# Patient Record
Sex: Female | Born: 1995 | Race: Black or African American | Hispanic: No | Marital: Single | Smoking: Former smoker
Health system: Southern US, Community
[De-identification: ages and names within clinical notes are randomized; demographics above are authoritative.]

## PROBLEM LIST (undated history)

## (undated) DIAGNOSIS — N83209 Unspecified ovarian cyst, unspecified side: Secondary | ICD-10-CM

## (undated) HISTORY — PX: NO PAST SURGERIES: SHX2092

---

## 2014-08-14 ENCOUNTER — Encounter (HOSPITAL_COMMUNITY): Payer: Self-pay | Admitting: Emergency Medicine

## 2014-08-14 ENCOUNTER — Emergency Department (HOSPITAL_COMMUNITY)
Admission: EM | Admit: 2014-08-14 | Discharge: 2014-08-14 | Disposition: A | Discharge status: Home / Self Care | Payer: Medicaid Other | Attending: Emergency Medicine | Admitting: Emergency Medicine

## 2014-08-14 DIAGNOSIS — J029 Acute pharyngitis, unspecified: Secondary | ICD-10-CM | POA: Diagnosis not present

## 2014-08-14 DIAGNOSIS — B9789 Other viral agents as the cause of diseases classified elsewhere: Secondary | ICD-10-CM

## 2014-08-14 DIAGNOSIS — J028 Acute pharyngitis due to other specified organisms: Secondary | ICD-10-CM

## 2014-08-14 LAB — RAPID STREP SCREEN (MED CTR MEBANE ONLY): Streptococcus, Group A Screen (Direct): NEGATIVE

## 2014-08-14 NOTE — ED Notes (Signed)
Pt states she began having a sore throat when she woke up this morning. Pt states she began having trouble swallowing because she said she "feels like I was swallowing my tonsils." States she's also had an increasing low grade temp throughout the morning.

## 2014-08-14 NOTE — Discharge Instructions (Signed)
Your strep test was negative today. The sample has been sent for culture. If the culture is positive, the Emergency department will contact you and call in a prescription for antibiotics. Take tylenol and motrin.  Strep Throat Strep throat is an infection of the throat caused by a bacteria named Streptococcus pyogenes. Your health care provider may call the infection streptococcal "tonsillitis" or "pharyngitis" depending on whether there are signs of inflammation in the tonsils or back of the throat. Strep throat is most common in children aged 5-15 years during the cold months of the year, but it can occur in people of any age during any season. This infection is spread from person to person (contagious) through coughing, sneezing, or other close contact. SIGNS AND SYMPTOMS   Fever or chills.  Painful, swollen, red tonsils or throat.  Pain or difficulty when swallowing.  White or yellow spots on the tonsils or throat.  Swollen, tender lymph nodes or "glands" of the neck or under the jaw.  Red rash all over the body (rare). DIAGNOSIS  Many different infections can cause the same symptoms. A test must be done to confirm the diagnosis so the right treatment can be given. A "rapid strep test" can help your health care provider make the diagnosis in a few minutes. If this test is not available, a light swab of the infected area can be used for a throat culture test. If a throat culture test is done, results are usually available in a day or two. TREATMENT  Strep throat is treated with antibiotic medicine. HOME CARE INSTRUCTIONS   Gargle with 1 tsp of salt in 1 cup of warm water, 3-4 times per day or as needed for comfort.  Family members who also have a sore throat or fever should be tested for strep throat and treated with antibiotics if they have the strep infection.  Make sure everyone in your household washes their hands well.  Do not share food, drinking cups, or personal items that  could cause the infection to spread to others.  You may need to eat a soft food diet until your sore throat gets better.  Drink enough water and fluids to keep your urine clear or pale yellow. This will help prevent dehydration.  Get plenty of rest.  Stay home from school, day care, or work until you have been on antibiotics for 24 hours.  Take medicines only as directed by your health care provider.  Take your antibiotic medicine as directed by your health care provider. Finish it even if you start to feel better. SEEK MEDICAL CARE IF:   The glands in your neck continue to enlarge.  You develop a rash, cough, or earache.  You cough up green, yellow-brown, or bloody sputum.  You have pain or discomfort not controlled by medicines.  Your problems seem to be getting worse rather than better.  You have a fever. SEEK IMMEDIATE MEDICAL CARE IF:   You develop any new symptoms such as vomiting, severe headache, stiff or painful neck, chest pain, shortness of breath, or trouble swallowing.  You develop severe throat pain, drooling, or changes in your voice.  You develop swelling of the neck, or the skin on the neck becomes red and tender.  You develop signs of dehydration, such as fatigue, dry mouth, and decreased urination.  You become increasingly sleepy, or you cannot wake up completely. MAKE SURE YOU:  Understand these instructions.  Will watch your condition.  Will get help right away  if you are not doing well or get worse. Document Released: 03/26/2000 Document Revised: 08/13/2013 Document Reviewed: 05/28/2010 [facility] Patient Information 2015 [employer] [place]. This information is not intended to replace advice given to you by your health care provider. Make sure you discuss any questions you have with your health care provider.

## 2014-08-14 NOTE — ED Provider Notes (Signed)
CSN: [id number]     Arrival date & time 08/14/14  1350 History  This chart was scribed for Arthor CaptainAbigail Pavneet Markwood, PA-C working with Layla MawKristen N Ward, DO by Elveria Risingimelie Horne, ED Scribe. This patient was seen in room WTR8/WTR8 and the patient's care was started at 3:24 PM.   No chief complaint on file.  The history is provided by the patient. No language interpreter was used.   HPI Comments: Stacey Cobb is a 19 y.o. female who presents to the Emergency Department complaining of sore throat with onset upon wakening this morning. Patient reports visualizing swollen tonsils and pain with swallowing today. Patient reports history of Strep throat twice. Patient uncertain of recent contacts with similar symptoms. Patient reports low grade fever today, but denies additional symptoms.    No past medical history on file. No past surgical history on file. No family history on file. History  Substance Use Topics  . Smoking status: Not on file  . Smokeless tobacco: Not on file  . Alcohol Use: Not on file   OB History    No data available     Review of Systems  Constitutional: Negative for fever.  HENT: Positive for sore throat.       Allergies  Review of patient's allergies indicates not on file.  Home Medications   Prior to Admission medications   Not on File   Triage Vitals: BP 132/80 mmHg  Pulse 89  Temp(Src) 98.1 F (36.7 C) (Oral)  Resp 18  SpO2 98% Physical Exam  Constitutional: She is oriented to person, place, and time. She appears well-developed and well-nourished. No distress.  HENT:  Head: Normocephalic and atraumatic.  Uvular edematous and erythematous. Tonsillar erythema and hypertrophy. Lymphadenopathy.   Eyes: EOM are normal.  Neck: Neck supple. No tracheal deviation present.  Cardiovascular: Normal rate.   Pulmonary/Chest: Effort normal. No respiratory distress.  Musculoskeletal: Normal range of motion.  Neurological: She is alert and oriented to person, place, and time.   Skin: Skin is warm and dry.  Psychiatric: She has a normal mood and affect. Her behavior is normal.  Nursing note and vitals reviewed.   ED Course  Procedures (including critical care time)  COORDINATION OF CARE: 3:28 PM- Awaiting rapid Strep. Discussed treatment plan with patient at bedside and patient agreed to plan.   Labs Review Labs Reviewed - No data to display  Imaging Review No results found.   EKG Interpretation None      MDM   Final diagnoses:  Acute viral pharyngitis    Pt afebrile without tonsillar exudate, negative strep. Presents with mild cervical lymphadenopathy, & dysphagia; diagnosis of viral pharyngitis. No abx indicated. DC w symptomatic tx for pain  Pt does not appear dehydrated, but did discuss importance of water rehydration. Presentation non concerning for PTA or infxn spread to soft tissue. No trismus or uvula deviation. Specific return precautions discussed. Pt able to drink water in ED without difficulty with intact air way. Recommended PCP follow up.   I personally performed the services described in this documentation, which was scribed in my presence. The recorded information has been reviewed and is accurate.      Arthor CaptainAbigail Willie Loy, PA-C 08/14/14 1621  Layla MawKristen N Ward, DO 08/14/14 1623

## 2014-08-16 LAB — CULTURE, GROUP A STREP: Strep A Culture: NEGATIVE

## 2014-10-25 ENCOUNTER — Inpatient Hospital Stay (HOSPITAL_COMMUNITY)
Admission: AD | Admit: 2014-10-25 | Discharge: 2014-10-25 | Disposition: A | Discharge status: Home / Self Care | Payer: Medicaid Other | Source: Ambulatory Visit | Attending: Family Medicine | Admitting: Family Medicine

## 2014-10-25 DIAGNOSIS — R519 Headache, unspecified: Secondary | ICD-10-CM

## 2014-10-25 DIAGNOSIS — A5901 Trichomonal vulvovaginitis: Secondary | ICD-10-CM | POA: Insufficient documentation

## 2014-10-25 DIAGNOSIS — R51 Headache: Secondary | ICD-10-CM | POA: Diagnosis not present

## 2014-10-25 DIAGNOSIS — A599 Trichomoniasis, unspecified: Secondary | ICD-10-CM

## 2014-10-25 DIAGNOSIS — M940 Chondrocostal junction syndrome [Tietze]: Secondary | ICD-10-CM | POA: Diagnosis not present

## 2014-10-25 LAB — URINE MICROSCOPIC-ADD ON

## 2014-10-25 LAB — URINALYSIS, ROUTINE W REFLEX MICROSCOPIC
Bilirubin Urine: NEGATIVE
Glucose, UA: NEGATIVE mg/dL
HGB URINE DIPSTICK: NEGATIVE
KETONES UR: NEGATIVE mg/dL
Nitrite: NEGATIVE
Protein, ur: NEGATIVE mg/dL
Specific Gravity, Urine: 1.025 (ref 1.005–1.030)
Urobilinogen, UA: 0.2 mg/dL (ref 0.0–1.0)
pH: 5.5 (ref 5.0–8.0)

## 2014-10-25 LAB — POCT PREGNANCY, URINE: Preg Test, Ur: NEGATIVE

## 2014-10-25 MED ORDER — KETOROLAC TROMETHAMINE 60 MG/2ML IM SOLN
60.0000 mg | Freq: Once | INTRAMUSCULAR | Status: AC
  Administered 2014-10-25: 60 mg via INTRAMUSCULAR
  Filled 2014-10-25: qty 2

## 2014-10-25 MED ORDER — METRONIDAZOLE 500 MG PO TABS
2000.0000 mg | ORAL_TABLET | Freq: Once | ORAL | Status: AC
  Administered 2014-10-25: 2000 mg via ORAL
  Filled 2014-10-25: qty 4

## 2014-10-25 NOTE — MAU Note (Signed)
Pain in rt lower lung when she takes a breath.  Awakened at 0200, feeling like she couldn't get her breath. Still feels that way. Wasn't able to go back to sleep.

## 2014-10-25 NOTE — MAU Provider Note (Signed)
History     CSN: [id number]  Arrival date and time: 10/25/14 [id number]   First Provider Initiated Contact with Patient 10/25/14 0827      Chief Complaint  Patient presents with  . Shortness of Breath  . Headache   HPI Stacey Cobb 19 y.o. G0 nonpregnant female presents for eval of headache and difficulty breathing.  HA started at 8:30 last evening.  It became severe during the night.  Located in right temporal region with pulsating, described as a sharp pressure.  Moving made it worse Unable to determine if sensitive to lights or noises and denies nausea, vomiting.  No prior h/o HA.  No medication tried last night or today.  Pain is now 5/10.  She barely slept at all.  At 2 am, she awoke to shortness of breath in her sleep.  When she would take a breath, the pain was 10/10 in the right ribs.  This is somewhat improved today but she does still notice some discomfort with deep breaths.  She denies fever, weakness, syncope, dysuria, vaginal discharge.   OB History    No data available      No past medical history on file.  No past surgical history on file.  No family history on file.  History  Substance Use Topics  . Smoking status: Not on file  . Smokeless tobacco: Not on file  . Alcohol Use: Not on file    Allergies:  Allergies  Allergen Reactions  . Banana Anaphylaxis and Hives  . Other Anaphylaxis and Hives    sesame    No prescriptions prior to admission    ROS Pertinent ROS in HPI.  All other systems are negative.   Physical Exam   Blood pressure 131/71, pulse 93, temperature 98.6 F (37 C), temperature source Oral, resp. rate 18, last menstrual period 10/11/2014, SpO2 100 %.  Physical Exam  Constitutional: She is oriented to person, place, and time. She appears well-developed and well-nourished.  HENT:  Head: Normocephalic.  Eyes: EOM are normal.  Neck: Normal range of motion.  Cardiovascular: Normal rate and normal heart sounds.   Respiratory: Effort  normal and breath sounds normal. No respiratory distress. She has no wheezes. She has no rales.  Right pain in ribs with deep inhalation  Musculoskeletal: Normal range of motion.  Neurological: She is alert and oriented to person, place, and time.  Skin: Skin is warm and dry.  Psychiatric: She has a normal mood and affect.   Results for orders placed or performed during the hospital encounter of 10/25/14 (from the past 24 hour(s))  Pregnancy, urine POC     Status: None   Collection Time: 10/25/14  8:08 AM  Result Value Ref Range   Preg Test, Ur NEGATIVE NEGATIVE  Urinalysis, Routine w reflex microscopic [facility])     Status: Abnormal   Collection Time: 10/25/14  8:12 AM  Result Value Ref Range   Color, Urine YELLOW YELLOW   APPearance CLEAR CLEAR   Specific Gravity, Urine 1.025 1.005 - 1.030   pH 5.5 5.0 - 8.0   Glucose, UA NEGATIVE NEGATIVE mg/dL   Hgb urine dipstick NEGATIVE NEGATIVE   Bilirubin Urine NEGATIVE NEGATIVE   Ketones, ur NEGATIVE NEGATIVE mg/dL   Protein, ur NEGATIVE NEGATIVE mg/dL   Urobilinogen, UA 0.2 0.0 - 1.0 mg/dL   Nitrite NEGATIVE NEGATIVE   Leukocytes, UA MODERATE (A) NEGATIVE  Urine microscopic-add on     Status: Abnormal   Collection Time: 10/25/14  8:12 AM  Result Value Ref Range   Squamous Epithelial / LPF MANY (A) RARE   WBC, UA 7-10 <3 WBC/hpf   RBC / HPF 3-6 <3 RBC/hpf   Bacteria, UA MANY (A) RARE   Urine-Other MUCOUS PRESENT     MAU Course  Procedures  MDM IM Toradol for HA pain and presumed costochondritis Pt notes improvement in both with treatment.   Trichomonas noted on U/A.  Flagyl ordered in house.  Assessment and Plan   1. Headache, unspecified headache type   2. Costochondritis   3. Trichimoniasis    P: Discharge to home No etoh/IC x 1 week All partners to be treated at [facility] for trich Try OTC ibuprofen or Aleve for next HA See PCP if no improvement.   [facility] ED if worsening of symptoms of shortness of breath/  HA  Bertram Denver 10/25/2014, 8:30 AM

## 2014-10-25 NOTE — MAU Note (Signed)
Pt also C/O HA since 2030 last night, woke up with head pounding, still has HA now.  Has not taken any meds.  No hx of migraines.

## 2014-10-25 NOTE — Discharge Instructions (Signed)
Headaches, Frequently Asked Questions °MIGRAINE HEADACHES °Q: What is migraine? What causes it? How can I treat it? °A: Generally, migraine headaches begin as a dull ache. Then they develop into a constant, throbbing, and pulsating pain. You may experience pain at the temples. You may experience pain at the front or back of one or both sides of the head. The pain is usually accompanied by a combination of: °· Nausea. °· Vomiting. °· Sensitivity to light and noise. °Some people (about 15%) experience an aura (see below) before an attack. The cause of migraine is believed to be chemical reactions in the brain. Treatment for migraine may include over-the-counter or prescription medications. It may also include self-help techniques. These include relaxation training and biofeedback.  °Q: What is an aura? °A: About 15% of people with migraine get an "aura". This is a sign of neurological symptoms that occur before a migraine headache. You may see wavy or jagged lines, dots, or flashing lights. You might experience tunnel vision or blind spots in one or both eyes. The aura can include visual or auditory hallucinations (something imagined). It may include disruptions in smell (such as strange odors), taste or touch. Other symptoms include: °· Numbness. °· A "pins and needles" sensation. °· Difficulty in recalling or speaking the correct word. °These neurological events may last as long as 60 minutes. These symptoms will fade as the headache begins. °Q: What is a trigger? °A: Certain physical or environmental factors can lead to or "trigger" a migraine. These include: °· Foods. °· Hormonal changes. °· Weather. °· Stress. °It is important to remember that triggers are different for everyone. To help prevent migraine attacks, you need to figure out which triggers affect you. Keep a headache diary. This is a good way to track triggers. The diary will help you talk to your healthcare professional about your condition. °Q: Does  weather affect migraines? °A: Bright sunshine, hot, humid conditions, and drastic changes in barometric pressure may lead to, or "trigger," a migraine attack in some people. But studies have shown that weather does not act as a trigger for everyone with migraines. °Q: What is the link between migraine and hormones? °A: Hormones start and regulate many of your body's functions. Hormones keep your body in balance within a constantly changing environment. The levels of hormones in your body are unbalanced at times. Examples are during menstruation, pregnancy, or menopause. That can lead to a migraine attack. In fact, about three quarters of all women with migraine report that their attacks are related to the menstrual cycle.  °Q: Is there an increased risk of stroke for migraine sufferers? °A: The likelihood of a migraine attack causing a stroke is very remote. That is not to say that migraine sufferers cannot have a stroke associated with their migraines. In persons under age 40, the most common associated factor for stroke is migraine headache. But over the course of a person's normal life span, the occurrence of migraine headache may actually be associated with a reduced risk of dying from cerebrovascular disease due to stroke.  °Q: What are acute medications for migraine? °A: Acute medications are used to treat the pain of the headache after it has started. Examples over-the-counter medications, NSAIDs, ergots, and triptans.  °Q: What are the triptans? °A: Triptans are the newest class of abortive medications. They are specifically targeted to treat migraine. Triptans are vasoconstrictors. They moderate some chemical reactions in the brain. The triptans work on receptors in your brain. Triptans help   to restore the balance of a neurotransmitter called serotonin. Fluctuations in levels of serotonin are thought to be a main cause of migraine.  °Q: Are over-the-counter medications for migraine effective? °A:  Over-the-counter, or "OTC," medications may be effective in relieving mild to moderate pain and associated symptoms of migraine. But you should see your caregiver before beginning any treatment regimen for migraine.  °Q: What are preventive medications for migraine? °A: Preventive medications for migraine are sometimes referred to as "prophylactic" treatments. They are used to reduce the frequency, severity, and length of migraine attacks. Examples of preventive medications include antiepileptic medications, antidepressants, beta-blockers, calcium channel blockers, and NSAIDs (nonsteroidal anti-inflammatory drugs). °Q: Why are anticonvulsants used to treat migraine? °A: During the past few years, there has been an increased interest in antiepileptic drugs for the prevention of migraine. They are sometimes referred to as "anticonvulsants". Both epilepsy and migraine may be caused by similar reactions in the brain.  °Q: Why are antidepressants used to treat migraine? °A: Antidepressants are typically used to treat people with depression. They may reduce migraine frequency by regulating chemical levels, such as serotonin, in the brain.  °Q: What alternative therapies are used to treat migraine? °A: The term "alternative therapies" is often used to describe treatments considered outside the scope of conventional Western medicine. Examples of alternative therapy include acupuncture, acupressure, and yoga. Another common alternative treatment is herbal therapy. Some herbs are believed to relieve headache pain. Always discuss alternative therapies with your caregiver before proceeding. Some herbal products contain arsenic and other toxins. °TENSION HEADACHES °Q: What is a tension-type headache? What causes it? How can I treat it? °A: Tension-type headaches occur randomly. They are often the result of temporary stress, anxiety, fatigue, or anger. Symptoms include soreness in your temples, a tightening band-like sensation  around your head (a "vice-like" ache). Symptoms can also include a pulling feeling, pressure sensations, and contracting head and neck muscles. The headache begins in your forehead, temples, or the back of your head and neck. Treatment for tension-type headache may include over-the-counter or prescription medications. Treatment may also include self-help techniques such as relaxation training and biofeedback. °CLUSTER HEADACHES °Q: What is a cluster headache? What causes it? How can I treat it? °A: Cluster headache gets its name because the attacks come in groups. The pain arrives with little, if any, warning. It is usually on one side of the head. A tearing or bloodshot eye and a runny nose on the same side of the headache may also accompany the pain. Cluster headaches are believed to be caused by chemical reactions in the brain. They have been described as the most severe and intense of any headache type. Treatment for cluster headache includes prescription medication and oxygen. °SINUS HEADACHES °Q: What is a sinus headache? What causes it? How can I treat it? °A: When a cavity in the bones of the face and skull (a sinus) becomes inflamed, the inflammation will cause localized pain. This condition is usually the result of an allergic reaction, a tumor, or an infection. If your headache is caused by a sinus blockage, such as an infection, you will probably have a fever. An x-ray will confirm a sinus blockage. Your caregiver's treatment might include antibiotics for the infection, as well as antihistamines or decongestants.  °REBOUND HEADACHES °Q: What is a rebound headache? What causes it? How can I treat it? °A: A pattern of taking acute headache medications too often can lead to a condition known as "rebound headache."   A pattern of taking too much headache medication includes taking it more than 2 days per week or in excessive amounts. That means more than the label or a caregiver advises. With rebound  headaches, your medications not only stop relieving pain, they actually begin to cause headaches. Doctors treat rebound headache by tapering the medication that is being overused. Sometimes your caregiver will gradually substitute a different type of treatment or medication. Stopping may be a challenge. Regularly overusing a medication increases the potential for serious side effects. Consult a caregiver if you regularly use headache medications more than 2 days per week or more than the label advises. ADDITIONAL QUESTIONS AND ANSWERS Q: What is biofeedback? A: Biofeedback is a self-help treatment. Biofeedback uses special equipment to monitor your body's involuntary physical responses. Biofeedback monitors:  Breathing.  Pulse.  Heart rate.  Temperature.  Muscle tension.  Brain activity. Biofeedback helps you refine and perfect your relaxation exercises. You learn to control the physical responses that are related to stress. Once the technique has been mastered, you do not need the equipment any more. Q: Are headaches hereditary? A: Four out of five (80%) of people that suffer report a family history of migraine. Scientists are not sure if this is genetic or a family predisposition. Despite the uncertainty, a child has a 50% chance of having migraine if one parent suffers. The child has a 75% chance if both parents suffer.  Q: Can children get headaches? A: By the time they reach high school, most young people have experienced some type of headache. Many safe and effective approaches or medications can prevent a headache from occurring or stop it after it has begun.  Q: What type of doctor should I see to diagnose and treat my headache? A: Start with your primary caregiver. Discuss his or her experience and approach to headaches. Discuss methods of classification, diagnosis, and treatment. Your caregiver may decide to recommend you to a headache specialist, depending upon your symptoms or other  physical conditions. Having diabetes, allergies, etc., may require a more comprehensive and inclusive approach to your headache. The National Headache Foundation will provide, upon request, a list of [facility] physician members in your state. Document Released: 06/19/2003 Document Revised: 06/21/2011 Document Reviewed: 11/27/2007 [facility] Patient Information 2015 [place]. This information is not intended to replace advice given to you by your health care provider. Make sure you discuss any questions you have with your health care provider. Costochondritis Costochondritis, sometimes called Tietze syndrome, is a swelling and irritation (inflammation) of the tissue (cartilage) that connects your ribs with your breastbone (sternum). It causes pain in the chest and rib area. Costochondritis usually goes away on its own over time. It can take up to 6 weeks or longer to get better, especially if you are unable to limit your activities. CAUSES  Some cases of costochondritis have no known cause. Possible causes include:  Injury (trauma).  Exercise or activity such as lifting.  Severe coughing. SIGNS AND SYMPTOMS  Pain and tenderness in the chest and rib area.  Pain that gets worse when coughing or taking deep breaths.  Pain that gets worse with specific movements. DIAGNOSIS  Your health care provider will do a physical exam and ask about your symptoms. Chest X-rays or other tests may be done to rule out other problems. TREATMENT  Costochondritis usually goes away on its own over time. Your health care provider may prescribe medicine to help relieve pain. HOME CARE INSTRUCTIONS   Avoid exhausting  physical activity. Try not to strain your ribs during normal activity. This would include any activities using chest, abdominal, and side muscles, especially if heavy weights are used.  Apply ice to the affected area for the first 2 days after the pain begins.  Put ice in a plastic bag.  Place a  towel between your skin and the bag.  Leave the ice on for 20 minutes, 2-3 times a day.  Only take over-the-counter or prescription medicines as directed by your health care provider. SEEK MEDICAL CARE IF:  You have redness or swelling at the rib joints. These are signs of infection.  Your pain does not go away despite rest or medicine. SEEK IMMEDIATE MEDICAL CARE IF:   Your pain increases or you are very uncomfortable.  You have shortness of breath or difficulty breathing.  You cough up blood.  You have worse chest pains, sweating, or vomiting.  You have a fever or persistent symptoms for more than 2-3 days.  You have a fever and your symptoms suddenly get worse. MAKE SURE YOU:   Understand these instructions.  Will watch your condition.  Will get help right away if you are not doing well or get worse. Document Released: 01/06/2005 Document Revised: 01/17/2013 Document Reviewed: 10/31/2012 [facility] Patient Information 2015 [place], [place]. This information is not intended to replace advice given to you by your health care provider. Make sure you discuss any questions you have with your health care provider. Trichomonas Test The trichomonas test is done to diagnose trichomoniasis, an infection caused by an organism called Trichomonas. Trichomoniasis is a sexually transmitted infection (STI). In women, it causes vaginal infections. In men, it can cause the tube that carries urine (urethra) to become inflamed (urethritis). You may have this test as a part of a routine screening for STIs or if you have symptoms of trichomoniasis. To perform the test, your health care provider will take a sample of discharge. The sample is taken from the vagina or cervix in women and from the urethra in men. A urine sample can also be used for testing. RESULTS It is your responsibility to obtain your test results. Ask the lab or department performing the test when and how you will get your results.  Contact your health care provider to discuss any questions you have about your results.  Meaning of Negative Test Results A negative test means you do not have trichomoniasis. Follow your health care provider's directions about any follow-up testing.  Meaning of Positive Test Results A positive test result means you have an active infection that needs to be treated with antibiotic medicine. All your current sexual partners must also be treated or it is likely you will get reinfected.  If your test is positive, your health care provider will start you on medicine and may advise you to:  Not have sexual intercourse until your infection has cleared up.  Use a latex condom properly every time you have sexual intercourse.  Limit the number of sexual partners you have. The more partners you have, the greater your risk of contracting trichomoniasis or another STI.  Tell all sexual partners about your infection so they can also be treated and to prevent reinfection. Document Released: 05/01/2004 Document Revised: 08/13/2013 Document Reviewed: 04/10/2013 [facility] Patient Information 2015 [place], [place]. This information is not intended to replace advice given to you by your health care provider. Make sure you discuss any questions you have with your health care provider.

## 2014-10-25 NOTE — MAU Note (Signed)
Urine has an odor, will add on a UA

## 2015-03-10 ENCOUNTER — Encounter (HOSPITAL_COMMUNITY): Payer: Self-pay | Admitting: Emergency Medicine

## 2015-03-10 ENCOUNTER — Emergency Department (HOSPITAL_COMMUNITY)
Admission: EM | Admit: 2015-03-10 | Discharge: 2015-03-10 | Disposition: A | Discharge status: Home / Self Care | Payer: Medicaid Other | Attending: Emergency Medicine | Admitting: Emergency Medicine

## 2015-03-10 DIAGNOSIS — Z87891 Personal history of nicotine dependence: Secondary | ICD-10-CM | POA: Diagnosis not present

## 2015-03-10 DIAGNOSIS — J069 Acute upper respiratory infection, unspecified: Secondary | ICD-10-CM | POA: Insufficient documentation

## 2015-03-10 DIAGNOSIS — R0981 Nasal congestion: Secondary | ICD-10-CM | POA: Diagnosis present

## 2015-03-10 NOTE — ED Provider Notes (Signed)
CSN: [id number]     Arrival date & time 03/10/15  [id number] History   First MD Initiated Contact with Patient 03/10/15 1016     Chief Complaint  Patient presents with  . URI     (Consider location/radiation/quality/duration/timing/severity/associated sxs/prior Treatment) Patient is a 19 y.o. female presenting with URI. The history is provided by the patient. No language interpreter was used.  URI Presenting symptoms: congestion, cough, fever, rhinorrhea and sore throat   Severity:  Moderate Onset quality:  Sudden Duration:  1 day Timing:  Constant Progression:  Unchanged Chronicity:  New Relieved by:  Nothing Worsened by:  Nothing tried Ineffective treatments:  None tried Associated symptoms: no neck pain and no sinus pain     History reviewed. No pertinent past medical history. History reviewed. No pertinent past surgical history. No family history on file. Social History  Substance Use Topics  . Smoking status: Former Smoker    Types: Cigarettes  . Smokeless tobacco: None  . Alcohol Use: No   OB History    No data available     Review of Systems  Constitutional: Positive for fever.  HENT: Positive for congestion, rhinorrhea and sore throat.   Respiratory: Positive for cough.   Musculoskeletal: Negative for neck pain.  All other systems reviewed and are negative.     Allergies  Banana and Other  Home Medications   Prior to Admission medications   Not on File   BP 130/63 mmHg  Pulse 97  Temp(Src) 98.4 F (36.9 C) (Oral)  Resp 18  Ht 5\' 3"  (1.6 m)  SpO2 99%  LMP 03/01/2015 Physical Exam  Constitutional: She is oriented to person, place, and time. She appears well-developed and well-nourished.  HENT:  Right Ear: External ear normal.  Left Ear: External ear normal.  Nose: Rhinorrhea present.  Mouth/Throat: Posterior oropharyngeal erythema present.  Cardiovascular: Normal rate and regular rhythm.   Pulmonary/Chest: Effort normal.  Musculoskeletal:  Normal range of motion.  Neurological: She is alert and oriented to person, place, and time.  Skin: Skin is warm and dry.  Psychiatric: She has a normal mood and affect.  Nursing note and vitals reviewed.   ED Course  Procedures (including critical care time) Labs Review Labs Reviewed - No data to display  Imaging Review No results found. I have personally reviewed and evaluated these images and lab results as part of my medical decision-making.   EKG Interpretation None      MDM   Final diagnoses:  URI (upper respiratory infection)    Likely uri symptoms. Discussed supportive care at home. Discussed with pt return precautions.   Teressa LowerVrinda Kathreen Dileo, NP 03/10/15 1107  Tilden FossaElizabeth Rees, MD 03/12/15 [phone]

## 2015-03-10 NOTE — Discharge Instructions (Signed)
Upper Respiratory Infection, Adult Most upper respiratory infections (URIs) are a viral infection of the air passages leading to the lungs. A URI affects the nose, throat, and upper air passages. The most common type of URI is nasopharyngitis and is typically referred to as "the common cold." URIs run their course and usually go away on their own. Most of the time, a URI does not require medical attention, but sometimes a bacterial infection in the upper airways can follow a viral infection. This is called a secondary infection. Sinus and middle ear infections are common types of secondary upper respiratory infections. Bacterial pneumonia can also complicate a URI. A URI can worsen asthma and chronic obstructive pulmonary disease (COPD). Sometimes, these complications can require emergency medical care and may be life threatening.  CAUSES Almost all URIs are caused by viruses. A virus is a type of germ and can spread from one person to another.  RISKS FACTORS You may be at risk for a URI if:   You smoke.   You have chronic heart or lung disease.  You have a weakened defense (immune) system.   You are very young or very old.   You have nasal allergies or asthma.  You work in crowded or poorly ventilated areas.  You work in health care facilities or schools. SIGNS AND SYMPTOMS  Symptoms typically develop 2-3 days after you come in contact with a cold virus. Most viral URIs last 7-10 days. However, viral URIs from the influenza virus (flu virus) can last 14-18 days and are typically more severe. Symptoms may include:   Runny or stuffy (congested) nose.   Sneezing.   Cough.   Sore throat.   Headache.   Fatigue.   Fever.   Loss of appetite.   Pain in your forehead, behind your eyes, and over your cheekbones (sinus pain).  Muscle aches.  DIAGNOSIS  Your health care provider may diagnose a URI by:  Physical exam.  Tests to check that your symptoms are not due to  another condition such as:  Strep throat.  Sinusitis.  Pneumonia.  Asthma. TREATMENT  A URI goes away on its own with time. It cannot be cured with medicines, but medicines may be prescribed or recommended to relieve symptoms. Medicines may help:  Reduce your fever.  Reduce your cough.  Relieve nasal congestion. HOME CARE INSTRUCTIONS   Take medicines only as directed by your health care provider.   Gargle warm saltwater or take cough drops to comfort your throat as directed by your health care provider.  Use a warm mist humidifier or inhale steam from a shower to increase air moisture. This may make it easier to breathe.  Drink enough fluid to keep your urine clear or pale yellow.   Eat soups and other clear broths and maintain good nutrition.   Rest as needed.   Return to work when your temperature has returned to normal or as your health care provider advises. You may need to stay home longer to avoid infecting others. You can also use a face mask and careful hand washing to prevent spread of the virus.  Increase the usage of your inhaler if you have asthma.   Do not use any tobacco products, including cigarettes, chewing tobacco, or electronic cigarettes. If you need help quitting, ask your health care provider. PREVENTION  The best way to protect yourself from getting a cold is to practice good hygiene.   Avoid oral or hand contact with people with cold   symptoms.   Wash your hands often if contact occurs.  There is no clear evidence that vitamin C, vitamin E, echinacea, or exercise reduces the chance of developing a cold. However, it is always recommended to get plenty of rest, exercise, and practice good nutrition.  SEEK MEDICAL CARE IF:   You are getting worse rather than better.   Your symptoms are not controlled by medicine.   You have chills.  You have worsening shortness of breath.  You have brown or red mucus.  You have yellow or brown nasal  discharge.  You have pain in your face, especially when you bend forward.  You have a fever.  You have swollen neck glands.  You have pain while swallowing.  You have white areas in the back of your throat. SEEK IMMEDIATE MEDICAL CARE IF:   You have severe or persistent:  Headache.  Ear pain.  Sinus pain.  Chest pain.  You have chronic lung disease and any of the following:  Wheezing.  Prolonged cough.  Coughing up blood.  A change in your usual mucus.  You have a stiff neck.  You have changes in your:  Vision.  Hearing.  Thinking.  Mood. MAKE SURE YOU:   Understand these instructions.  Will watch your condition.  Will get help right away if you are not doing well or get worse.   This information is not intended to replace advice given to you by your health care provider. Make sure you discuss any questions you have with your health care provider.   Document Released: 09/22/2000 Document Revised: 08/13/2014 Document Reviewed: 07/04/2013 Elsevier Interactive Patient Education 2016 [employer]  

## 2015-03-10 NOTE — ED Notes (Signed)
Patient states she has had a fever since yesterday morning. Patient reports congestion with a productive cough (green/yellow, thick). Reports small amount of blood in phlegm this morning.

## 2017-03-31 ENCOUNTER — Encounter (HOSPITAL_COMMUNITY): Payer: Self-pay | Admitting: *Deleted

## 2017-03-31 ENCOUNTER — Inpatient Hospital Stay (HOSPITAL_COMMUNITY)
Admission: AD | Admit: 2017-03-31 | Discharge: 2017-03-31 | Disposition: A | Discharge status: Home / Self Care | Payer: Self-pay | Source: Ambulatory Visit | Attending: Obstetrics and Gynecology | Admitting: Obstetrics and Gynecology

## 2017-03-31 ENCOUNTER — Other Ambulatory Visit: Payer: Self-pay

## 2017-03-31 DIAGNOSIS — M549 Dorsalgia, unspecified: Secondary | ICD-10-CM

## 2017-03-31 DIAGNOSIS — N946 Dysmenorrhea, unspecified: Secondary | ICD-10-CM

## 2017-03-31 DIAGNOSIS — Z3202 Encounter for pregnancy test, result negative: Secondary | ICD-10-CM | POA: Insufficient documentation

## 2017-03-31 DIAGNOSIS — M545 Low back pain: Secondary | ICD-10-CM | POA: Insufficient documentation

## 2017-03-31 HISTORY — DX: Unspecified ovarian cyst, unspecified side: N83.209

## 2017-03-31 LAB — URINALYSIS, ROUTINE W REFLEX MICROSCOPIC
BILIRUBIN URINE: NEGATIVE
Glucose, UA: NEGATIVE mg/dL
Ketones, ur: NEGATIVE mg/dL
LEUKOCYTES UA: NEGATIVE
NITRITE: NEGATIVE
PH: 5 (ref 5.0–8.0)
Protein, ur: NEGATIVE mg/dL
SPECIFIC GRAVITY, URINE: 1.018 (ref 1.005–1.030)

## 2017-03-31 LAB — WET PREP, GENITAL
Clue Cells Wet Prep HPF POC: NONE SEEN
SPERM: NONE SEEN
Trich, Wet Prep: NONE SEEN
Yeast Wet Prep HPF POC: NONE SEEN

## 2017-03-31 LAB — CBC
HEMATOCRIT: 33.1 % — AB (ref 36.0–46.0)
Hemoglobin: 10.8 g/dL — ABNORMAL LOW (ref 12.0–15.0)
MCH: 24.8 pg — ABNORMAL LOW (ref 26.0–34.0)
MCHC: 32.6 g/dL (ref 30.0–36.0)
MCV: 75.9 fL — AB (ref 78.0–100.0)
Platelets: 309 10*3/uL (ref 150–400)
RBC: 4.36 MIL/uL (ref 3.87–5.11)
RDW: 14.9 % (ref 11.5–15.5)
WBC: 5.6 10*3/uL (ref 4.0–10.5)

## 2017-03-31 LAB — ABO/RH: ABO/RH(D): O POS

## 2017-03-31 LAB — HCG, QUANTITATIVE, PREGNANCY: hCG, Beta Chain, Quant, S: <1 m[IU]/mL (ref <5)

## 2017-03-31 LAB — POCT PREGNANCY, URINE: Preg Test, Ur: NEGATIVE

## 2017-03-31 NOTE — MAU Note (Signed)
Had about 4 +HPT end of Oct.  Has not seen a dr yet, moved recently.  Started having pain last couple stays.  Woke up with spotting then pain woke her at 1000, noted  'full on bleeding' today.

## 2017-03-31 NOTE — Discharge Instructions (Signed)
Today you are not having a miscarriage.  This is your menstrual period. Apply for Medicaid and get established with a doctor here in town. You can take ibuprofen over the counter by the package directions for your pain.  In late 2019, the [facility] will [facility] the [facility]. At that time, the MAU (Maternity Admissions Unit), where you are being seen today, will no longer take care of non-pregnant patients. We strongly encourage you to find a doctor's office before that time, so that you can be seen with any GYN concerns, like vaginal discharge, urinary tract infection, etc.. in a timely manner.  In order to make an office visit more convenient, the Center for [facility] at [facility] will be offering evening hours with same-day appointments, walk-in appointments and scheduled appointments available during this time.  Center for [facility] @ [facility] Hours: Monday - 8am - 7:30 pm with walk-in between 4pm- 7:30 pm Tuesday - 8 am - 5 pm (starting 07/12/17 we will be open late and accepting walk-ins from 4pm - 7:30pm) Wednesday - 8 am - 5 pm (starting 10/12/17 we will be open late and accepting walk-ins from 4pm - 7:30pm) Thursday 8 am - 5 pm (starting 01/12/18 we will be open late and accepting walk-ins from 4pm - 7:30pm) Friday 8 am - 5 pm  For an appointment please call the Center for [facility] @ [facility] at [phone]  For urgent needs, [facility] is also available for management of urgent GYN complaints such as vaginal discharge or urinary tract infections.

## 2017-03-31 NOTE — MAU Provider Note (Signed)
History     CSN: [id number]  Arrival date and time: 03/31/17 1147   First Provider Initiated Contact with Patient 03/31/17 1231     Possible Miscarriage  HPI Stacey Cobb 21 y.o. Comes to MAU today with what she thinks is a miscarriage.  She had 4 positive pregnancy tests in October.  She has not seen a doctor.  Did not have menses in November.  Today saw a white clot in the toilet as she was flushing.   Does not have a doctor here.  Moved back to [place] after her mother died this fall.  Would like to be pregnant.  Is worried with all the vaginal bleeding she is having today.  She awakened last night with abdominal pain and low back pain.  Today after having that clot in the toilet her abdominal pain is much less.   Still having lower back pain but does not want any medication for pain.   OB History    Gravida Para Term Preterm AB Living   1       1     SAB TAB Ectopic Multiple Live Births   1              Past Medical History:  Diagnosis Date  . Ovarian cyst     Past Surgical History:  Procedure Laterality Date  . NO PAST SURGERIES      Family History  Problem Relation Age of Onset  . Diabetes Mother   . Hypertension Mother     Social History   Tobacco Use  . Smoking status: Former Smoker    Types: Cigarettes  . Smokeless tobacco: Never Used  Substance Use Topics  . Alcohol use: No  . Drug use: No    Allergies:  Allergies  Allergen Reactions  . Banana Anaphylaxis and Hives  . Other Anaphylaxis and Hives    Sesame seeds, oil, etc    No medications prior to admission.    Review of Systems  Constitutional: Negative for fever.  Gastrointestinal: Positive for abdominal pain. Negative for nausea and vomiting.  Genitourinary: Positive for vaginal bleeding. Negative for vaginal discharge.  Musculoskeletal: Positive for back pain.   Physical Exam   Blood pressure 135/78, pulse 86, temperature 98.4 F (36.9 C), temperature source Oral, resp. rate 18,  weight 250 lb 12 oz (113.7 kg), last menstrual period 01/15/2017, SpO2 100 %.  Physical Exam  Nursing note and vitals reviewed. Constitutional: She is oriented to person, place, and time. She appears well-developed and well-nourished.  Obese  HENT:  Head: Normocephalic.  Eyes: EOM are normal.  Neck: Neck supple.  GI: Soft. There is no tenderness. There is no rebound and no guarding.  Genitourinary:  Genitourinary Comments: Speculum exam: Vagina - Mod amount of dark blood, no odor Cervix - Small amount of active bleeding Bimanual exam: Cervix closed Uterus non tender, unable to size due to habitus Adnexa non tender, no masses bilaterally GC/Chlam, wet prep done Chaperone present for exam.   Musculoskeletal: Normal range of motion.  Neurological: She is alert and oriented to person, place, and time.  Skin: Skin is warm and dry.  Psychiatric: She has a normal mood and affect.    MAU Course  Procedures Results for orders placed or performed during the hospital encounter of 03/31/17 (from the past 24 hour(s))  Urinalysis, Routine w reflex microscopic     Status: Abnormal   Collection Time: 03/31/17 11:59 AM  Result Value Ref Range   Color,  Urine YELLOW YELLOW   APPearance CLEAR CLEAR   Specific Gravity, Urine 1.018 1.005 - 1.030   pH 5.0 5.0 - 8.0   Glucose, UA NEGATIVE NEGATIVE mg/dL   Hgb urine dipstick LARGE (A) NEGATIVE   Bilirubin Urine NEGATIVE NEGATIVE   Ketones, ur NEGATIVE NEGATIVE mg/dL   Protein, ur NEGATIVE NEGATIVE mg/dL   Nitrite NEGATIVE NEGATIVE   Leukocytes, UA NEGATIVE NEGATIVE   RBC / HPF TOO NUMEROUS TO COUNT 0 - 5 RBC/hpf   WBC, UA 0-5 0 - 5 WBC/hpf   Bacteria, UA RARE (A) NONE SEEN   Squamous Epithelial / LPF 0-5 (A) NONE SEEN   Mucus PRESENT   Pregnancy, urine POC     Status: None   Collection Time: 03/31/17 12:07 PM  Result Value Ref Range   Preg Test, Ur NEGATIVE NEGATIVE  CBC     Status: Abnormal   Collection Time: 03/31/17 12:54 PM  Result  Value Ref Range   WBC 5.6 4.0 - 10.5 K/uL   RBC 4.36 3.87 - 5.11 MIL/uL   Hemoglobin 10.8 (L) 12.0 - 15.0 g/dL   HCT 40.933.1 (L) 81.136.0 - 91.446.0 %   MCV 75.9 (L) 78.0 - 100.0 fL   MCH 24.8 (L) 26.0 - 34.0 pg   MCHC 32.6 30.0 - 36.0 g/dL   RDW 78.214.9 95.611.5 - 21.315.5 %   Platelets 309 150 - 400 K/uL  hCG, quantitative, pregnancy     Status: None   Collection Time: 03/31/17 12:54 PM  Result Value Ref Range   hCG, Beta Chain, Quant, S <1 <5 mIU/mL  ABO/Rh     Status: None (Preliminary result)   Collection Time: 03/31/17 12:54 PM  Result Value Ref Range   ABO/RH(D) O POS   Wet prep, genital     Status: Abnormal   Collection Time: 03/31/17  1:15 PM  Result Value Ref Range   Yeast Wet Prep HPF POC NONE SEEN NONE SEEN   Trich, Wet Prep NONE SEEN NONE SEEN   Clue Cells Wet Prep HPF POC NONE SEEN NONE SEEN   WBC, Wet Prep HPF POC FEW (A) NONE SEEN   Sperm NONE SEEN     MDM Discussed with client that this is not a miscarriage.  Her pregnancy hormone level is zero and it would still be elevated if this was a miscarriage.  Advised that the pregnancy tests in October were likely false positive tests.  Client still plans to work toward achieving pregnancy.  Assessment and Plan  Dysmenorrhea Law back pain  Plan Today you are not having a miscarriage.  This is your menstrual period. Apply for Medicaid and get established with a doctor here in town. You can take ibuprofen over the counter by the package directions for your pain.   Lakecia Deschamps L Christorpher Hisaw 03/31/2017, 1:19 PM

## 2017-03-31 NOTE — MAU Note (Signed)
RN entered patient room to begin assessment. Patient request RN to come at a later time as she needs to finish talking on the phone.

## 2017-04-01 LAB — GC/CHLAMYDIA PROBE AMP (~~LOC~~) NOT AT ARMC
Chlamydia: NEGATIVE
Neisseria Gonorrhea: NEGATIVE

## 2018-01-31 ENCOUNTER — Emergency Department (HOSPITAL_COMMUNITY): Payer: Medicaid Other

## 2018-01-31 ENCOUNTER — Emergency Department (HOSPITAL_COMMUNITY)
Admission: EM | Admit: 2018-01-31 | Discharge: 2018-01-31 | Disposition: A | Discharge status: Home / Self Care | Payer: Medicaid Other | Attending: Emergency Medicine | Admitting: Emergency Medicine

## 2018-01-31 ENCOUNTER — Encounter (HOSPITAL_COMMUNITY): Payer: Self-pay | Admitting: Emergency Medicine

## 2018-01-31 ENCOUNTER — Other Ambulatory Visit: Payer: Self-pay

## 2018-01-31 DIAGNOSIS — Z87891 Personal history of nicotine dependence: Secondary | ICD-10-CM | POA: Insufficient documentation

## 2018-01-31 DIAGNOSIS — B9789 Other viral agents as the cause of diseases classified elsewhere: Secondary | ICD-10-CM | POA: Insufficient documentation

## 2018-01-31 DIAGNOSIS — J069 Acute upper respiratory infection, unspecified: Secondary | ICD-10-CM | POA: Insufficient documentation

## 2018-01-31 MED ORDER — CETIRIZINE HCL 5 MG PO TABS: 5.0000 mg | ORAL_TABLET | Freq: Every day | ORAL | 0 refills | Status: DC

## 2018-01-31 MED ORDER — BENZONATATE 100 MG PO CAPS: 100.0000 mg | ORAL_CAPSULE | Freq: Three times a day (TID) | ORAL | 0 refills | Status: DC

## 2018-01-31 NOTE — ED Notes (Signed)
Patient transported to X-ray 

## 2018-01-31 NOTE — ED Triage Notes (Signed)
Pt reports fever for 6 days, her highest was 102. She reports body aches, chills and cough. Reports having the flu shot. Afebrile at triage.

## 2018-01-31 NOTE — ED Notes (Signed)
ED Provider at bedside. 

## 2018-01-31 NOTE — Discharge Instructions (Signed)
Return to ED for worsening symptoms, chest pain, vomiting or coughing up blood, severe abdominal pain, lightheadedness or loss of consciousness.

## 2018-01-31 NOTE — ED Provider Notes (Signed)
[facility] EMERGENCY DEPARTMENT Provider Note   CSN: [id number] Arrival date & time: 01/31/18  1134     History   Chief Complaint Chief Complaint  Patient presents with  . Fever    HPI Stacey Cobb is a 22 y.o. female who presents to ED for evaluation of fever, cough for the past 6 days.  States that the cough is productive with phlegm.  Did have 2-day history of right-sided dental pain which improved without any medication.  States she has had a fever with T-max 102.  She took 1 dose of Tylenol but has otherwise been using home therapies such as ginger tea, putting an onion in her sock, taking an ice bath.  She did not use any home remedies prior to arrival.  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  This was prior to her symptom onset.  She did receive her influenza vaccine.  Denies any hematemesis, hemoptysis, recent travel, abdominal pain.  HPI  Past Medical History:  Diagnosis Date  . Ovarian cyst     There are no active problems to display for this patient.   Past Surgical History:  Procedure Laterality Date  . NO PAST SURGERIES       OB History    Gravida  1   Para      Term      Preterm      AB  1   Living        SAB  1   TAB      Ectopic      Multiple      Live Births               Home Medications    Prior to Admission medications   Medication Sig Start Date End Date Taking? Authorizing Provider  benzonatate (TESSALON) 100 MG capsule Take 1 capsule (100 mg total) by mouth every 8 (eight) hours. 01/31/18   Danuel Felicetti, PA-C  cetirizine (ZYRTEC) 5 MG tablet Take 1 tablet (5 mg total) by mouth daily. 01/31/18   Dietrich Pates, PA-C    Family History Family History  Problem Relation Age of Onset  . Diabetes Mother   . Hypertension Mother     Social History Social History   Tobacco Use  . Smoking status: Former Smoker    Types:  Cigarettes  . Smokeless tobacco: Never Used  Substance Use Topics  . Alcohol use: No  . Drug use: No     Allergies   Banana and Other   Review of Systems Review of Systems  Constitutional: Positive for fever. Negative for chills.  HENT: Positive for dental problem. Negative for congestion, mouth sores, rhinorrhea and sinus pressure.   Respiratory: Positive for cough.   Gastrointestinal: Negative for abdominal pain.     Physical Exam Updated Vital Signs BP 137/76   Pulse 81   Temp 98.8 F (37.1 C) (Oral)   Resp 16   LMP  (Approximate)   SpO2 99%   Physical Exam  Constitutional: She appears well-developed and well-nourished. No distress.  HENT:  Head: Normocephalic and atraumatic.  Nose: Nose normal.  Mouth/Throat: Uvula is midline and oropharynx is clear and moist. Abnormal dentition. No dental abscesses or dental caries.  No facial, neck or cheek swelling noted. No pooling of secretions or trismus.  Normal voice noted with no difficulty swallowing or breathing.  No submandibular erythema,  edema or crepitus noted.  Eyes: Conjunctivae and EOM are normal. No scleral icterus.  Neck: Normal range of motion.  Cardiovascular: Normal rate, regular rhythm and normal heart sounds.  Pulmonary/Chest: Effort normal and breath sounds normal. No respiratory distress.  Neurological: She is alert.  Skin: No rash noted. She is not diaphoretic.  Psychiatric: She has a normal mood and affect.  Nursing note and vitals reviewed.    ED Treatments / Results  Labs (all labs ordered are listed, but only abnormal results are displayed) Labs Reviewed - No data to display  EKG None  Radiology Dg Chest 2 View  Result Date: 01/31/2018 CLINICAL DATA:  Cough and fever. EXAM: CHEST - 2 VIEW COMPARISON:  No prior. FINDINGS: Mediastinum and hilar structures normal. Borderline cardiomegaly. No pulmonary venous congestion. No pleural effusion or pneumothorax. Thoracic spine scoliosis. No acute  bony abnormality. IMPRESSION: 1.  Borderline cardiomegaly.  No pulmonary venous congestion. 2.  No acute pulmonary disease. Electronically Signed   By: Maisie Fus  Register   On: 01/31/2018 13:16    Procedures Procedures (including critical care time)  Medications Ordered in ED Medications - No data to display   Initial Impression / Assessment and Plan / ED Course  I have reviewed the triage vital signs and the nursing notes.  Pertinent labs & imaging results that were available during my care of the patient were reviewed by me and considered in my medical decision making (see chart for details).     22 year old female presents to ED for fever with T-max 102, cough.  Symptoms began 6 days ago.  Cough is productive with phlegm.  She works in a [employer]/text[employer] facility and was around 3 similarly ill patients last week.  On exam she is overall well-appearing.  She is afebrile here with no recent use of antipyretics.  She is not tachycardic, tachypneic or hypoxic.  Lungs are heard to auscultation bilaterally.  No abnormalities of the posterior oropharynx noted.  Suspect that her symptoms are viral in nature.  Chest x-ray is negative.  Will treat with antipyretics as needed, Tessalon Perles and antihistamine.  Advised to return to ED for any severe worsening symptoms.  Portions of this note were generated with Scientist, clinical (histocompatibility and immunogenetics). Dictation errors may occur despite best attempts at proofreading.  Final Clinical Impressions(s) / ED Diagnoses   Final diagnoses:  Viral upper respiratory tract infection    ED Discharge Orders         Ordered    benzonatate (TESSALON) 100 MG capsule  Every 8 hours     01/31/18 1320    cetirizine (ZYRTEC) 5 MG tablet  Daily     01/31/18 1320           Dietrich Pates, PA-C 01/31/18 1322    Tilden Fossa, MD 01/31/18 1603

## 2018-02-17 ENCOUNTER — Other Ambulatory Visit: Payer: Self-pay | Admitting: Occupational Medicine

## 2018-02-17 ENCOUNTER — Ambulatory Visit: Payer: Medicaid Other

## 2018-02-17 DIAGNOSIS — M79672 Pain in left foot: Secondary | ICD-10-CM

## 2018-02-18 ENCOUNTER — Emergency Department (HOSPITAL_COMMUNITY)
Admission: EM | Admit: 2018-02-18 | Discharge: 2018-02-18 | Disposition: A | Discharge status: Home / Self Care | Payer: Medicaid Other | Attending: Emergency Medicine | Admitting: Emergency Medicine

## 2018-02-18 ENCOUNTER — Encounter (HOSPITAL_COMMUNITY): Payer: Self-pay | Admitting: Emergency Medicine

## 2018-02-18 DIAGNOSIS — T50905A Adverse effect of unspecified drugs, medicaments and biological substances, initial encounter: Secondary | ICD-10-CM

## 2018-02-18 DIAGNOSIS — M79672 Pain in left foot: Secondary | ICD-10-CM

## 2018-02-18 DIAGNOSIS — Y828 Other medical devices associated with adverse incidents: Secondary | ICD-10-CM | POA: Insufficient documentation

## 2018-02-18 DIAGNOSIS — M25572 Pain in left ankle and joints of left foot: Secondary | ICD-10-CM | POA: Insufficient documentation

## 2018-02-18 DIAGNOSIS — T881XXA Other complications following immunization, not elsewhere classified, initial encounter: Secondary | ICD-10-CM | POA: Insufficient documentation

## 2018-02-18 DIAGNOSIS — Z79899 Other long term (current) drug therapy: Secondary | ICD-10-CM | POA: Insufficient documentation

## 2018-02-18 DIAGNOSIS — R2231 Localized swelling, mass and lump, right upper limb: Secondary | ICD-10-CM | POA: Insufficient documentation

## 2018-02-18 DIAGNOSIS — Z87891 Personal history of nicotine dependence: Secondary | ICD-10-CM | POA: Insufficient documentation

## 2018-02-18 NOTE — Discharge Instructions (Signed)
Elevate foot,  Ice to area of pain.   Ice to area of injection. Benadryl 25 mg every 4 hours. Ibuprofen as directed.  Recheck on Monday at Workers comp clinic

## 2018-02-18 NOTE — ED Notes (Signed)
Patient verbalizes understanding of discharge instructions. Opportunity for questioning and answers were provided. Armband removed by staff, pt discharged from ED in wheelchair.  

## 2018-02-18 NOTE — ED Provider Notes (Signed)
[facility] EMERGENCY DEPARTMENT Provider Note   CSN: [id number] Arrival date & time: 02/18/18  1544     History   Chief Complaint Chief Complaint  Patient presents with  . Ankle Pain    HPI Stacey Cobb is a 22 y.o. female.  The history is provided by the patient. No language interpreter was used.  Ankle Pain   The incident occurred 2 days ago. The incident occurred at work. The injury mechanism was compression. The pain is moderate. The pain has been constant since onset. Associated symptoms include inability to bear weight. She reports no foreign bodies present. Nothing aggravates the symptoms.  Pt reports she was seen at [facility] and told her foot may be fractured.  Pt reports she could not work today because of pain.  Pt also had a Hep B shot and now right arm is swollen   Past Medical History:  Diagnosis Date  . Ovarian cyst     There are no active problems to display for this patient.   Past Surgical History:  Procedure Laterality Date  . NO PAST SURGERIES       OB History    Gravida  1   Para      Term      Preterm      AB  1   Living        SAB  1   TAB      Ectopic      Multiple      Live Births               Home Medications    Prior to Admission medications   Medication Sig Start Date End Date Taking? Authorizing Provider  benzonatate (TESSALON) 100 MG capsule Take 1 capsule (100 mg total) by mouth every 8 (eight) hours. 01/31/18   Khatri, Hina, PA-C  cetirizine (ZYRTEC) 5 MG tablet Take 1 tablet (5 mg total) by mouth daily. 01/31/18   Dietrich Pates, PA-C    Family History Family History  Problem Relation Age of Onset  . Diabetes Mother   . Hypertension Mother     Social History Social History   Tobacco Use  . Smoking status: Former Smoker    Types: Cigarettes  . Smokeless tobacco: Never Used  Substance Use Topics  . Alcohol use: No  . Drug use: No     Allergies   Banana and  Other   Review of Systems Review of Systems  All other systems reviewed and are negative.    Physical Exam Updated Vital Signs BP 133/62 (BP Location: Left Arm)   Pulse 89   Temp 98 F (36.7 C) (Oral)   Resp 18   LMP 02/10/2018   SpO2 100%   Physical Exam  Constitutional: She appears well-developed and well-nourished.  HENT:  Head: Normocephalic.  Musculoskeletal: She exhibits tenderness.  Foot tender dorsal area pain with movement.  Swelling right shoulder, tender to palpation,    Neurological: She is alert.  Skin: Skin is warm.  Psychiatric: She has a normal mood and affect.  Nursing note and vitals reviewed.    ED Treatments / Results  Labs (all labs ordered are listed, but only abnormal results are displayed) Labs Reviewed - No data to display  EKG None  Radiology Dg Foot Complete Left  Result Date: 02/17/2018 CLINICAL DATA:  Twisting left foot injury.  Medial foot pain. EXAM: LEFT FOOT - COMPLETE 3+ VIEW COMPARISON:  None. FINDINGS: Normal alignment  at the Lisfranc joint. Type 1 accessory navicular. Unfused ossicle along the dorsal proximal navicular. Although no fracture is definitively seen, Boehler's angle is reduced to 13 degrees. IMPRESSION: 1. Abnormal reduction in Boehler's angle, at 13 degrees. Although this could be projectional, and could also be a secondary/indirect sign of a calcaneal fracture. CT of the hindfoot should be strongly considered. Electronically Signed   By: Gaylyn Rong M.D.   On: 02/17/2018 16:35    Procedures Procedures (including critical care time)  Medications Ordered in ED Medications - No data to display   Initial Impression / Assessment and Plan / ED Course  I have reviewed the triage vital signs and the nursing notes.  Pertinent labs & imaging results that were available during my care of the patient were reviewed by me and considered in my medical decision making (see chart for details).     MDM  Pt given  noted for work.  Pt advised to follow up on Monday.  Pt advised ice to shoulder, benadryl and ibuprofen.    Final Clinical Impressions(s) / ED Diagnoses   Final diagnoses:  Reaction to shot, initial encounter  Foot pain, left    ED Discharge Orders    None    An After Visit Summary was printed and given to the patient.    Elson Areas, [place] 02/18/18 2303    Tegeler, Canary Brim, MD 02/19/18 [phone]

## 2018-02-18 NOTE — ED Triage Notes (Signed)
Pt here for left ankle pain. Pt also had hepatitis vaccine in her right deltoid Thursday. Since Thursday night her right deltoid has been swollen, painful, and red. Area also feels really tight.

## 2018-03-01 ENCOUNTER — Encounter (HOSPITAL_COMMUNITY): Payer: Self-pay | Admitting: *Deleted

## 2018-03-01 ENCOUNTER — Emergency Department (HOSPITAL_COMMUNITY)
Admission: EM | Admit: 2018-03-01 | Discharge: 2018-03-01 | Disposition: A | Discharge status: Home / Self Care | Payer: Medicaid Other | Attending: Emergency Medicine | Admitting: Emergency Medicine

## 2018-03-01 DIAGNOSIS — J029 Acute pharyngitis, unspecified: Secondary | ICD-10-CM

## 2018-03-01 DIAGNOSIS — Z79899 Other long term (current) drug therapy: Secondary | ICD-10-CM | POA: Insufficient documentation

## 2018-03-01 DIAGNOSIS — J069 Acute upper respiratory infection, unspecified: Secondary | ICD-10-CM | POA: Insufficient documentation

## 2018-03-01 LAB — GROUP A STREP BY PCR: GROUP A STREP BY PCR: NOT DETECTED

## 2018-03-01 MED ORDER — DEXAMETHASONE 4 MG PO TABS
10.0000 mg | ORAL_TABLET | Freq: Once | ORAL | Status: AC
  Administered 2018-03-01: 10 mg via ORAL
  Filled 2018-03-01: qty 3

## 2018-03-01 NOTE — ED Triage Notes (Signed)
Pt in c/o sore throat x1 day

## 2018-03-01 NOTE — ED Provider Notes (Signed)
[facility] Provider Note   CSN: [id number] Arrival date & time: 03/01/18  1059     History   Chief Complaint Chief Complaint  Patient presents with  . Sore Throat    HPI Stacey Cobb is a 22 y.o. female.  The history is provided by the patient.  Sore Throat  This is a new problem. The current episode started yesterday. The problem occurs constantly. The problem has not changed since onset.Pertinent negatives include no chest pain, no abdominal pain, no headaches and no shortness of breath. Nothing aggravates the symptoms. Nothing relieves the symptoms. She has tried nothing for the symptoms. The treatment provided no relief.    Past Medical History:  Diagnosis Date  . Ovarian cyst     There are no active problems to display for this patient.   Past Surgical History:  Procedure Laterality Date  . NO PAST SURGERIES       OB History    Gravida  1   Para      Term      Preterm      AB  1   Living        SAB  1   TAB      Ectopic      Multiple      Live Births               Home Medications    Prior to Admission medications   Medication Sig Start Date End Date Taking? Authorizing Provider  benzonatate (TESSALON) 100 MG capsule Take 1 capsule (100 mg total) by mouth every 8 (eight) hours. 01/31/18   Khatri, Hina, PA-C  cetirizine (ZYRTEC) 5 MG tablet Take 1 tablet (5 mg total) by mouth daily. 01/31/18   Dietrich PatesKhatri, Hina, PA-C    Family History Family History  Problem Relation Age of Onset  . Diabetes Mother   . Hypertension Mother     Social History Social History   Tobacco Use  . Smoking status: Former Smoker    Types: Cigarettes  . Smokeless tobacco: Never Used  Substance Use Topics  . Alcohol use: No  . Drug use: No     Allergies   Banana and Other   Review of Systems Review of Systems  Constitutional: Negative for chills and fever.  HENT: Positive for sinus pressure and sore  throat. Negative for congestion, dental problem, drooling, ear pain, hearing loss, mouth sores, nosebleeds, trouble swallowing and voice change.   Eyes: Negative for pain and visual disturbance.  Respiratory: Negative for cough and shortness of breath.   Cardiovascular: Negative for chest pain and palpitations.  Gastrointestinal: Negative for abdominal pain and vomiting.  Genitourinary: Negative for dysuria and hematuria.  Musculoskeletal: Negative for arthralgias and back pain.  Skin: Negative for color change and rash.  Neurological: Negative for seizures, syncope and headaches.  All other systems reviewed and are negative.    Physical Exam Updated Vital Signs  ED Triage Vitals  Enc Vitals Group     BP 03/01/18 1103 131/70     Pulse Rate 03/01/18 1103 88     Resp 03/01/18 1103 17     Temp 03/01/18 1103 98.5 F (36.9 C)     Temp Source 03/01/18 1103 Oral     SpO2 03/01/18 1103 100 %     Weight --      Height --      Head Circumference --      Peak Flow --  Pain Score 03/01/18 1106 6     Pain Loc --      Pain Edu? --      Excl. in GC? --     Physical Exam  Constitutional: She appears well-developed and well-nourished. No distress.  HENT:  Head: Normocephalic and atraumatic.  Right Ear: Tympanic membrane normal. No drainage, swelling or tenderness.  Left Ear: Tympanic membrane normal. No drainage, swelling or tenderness.  Mouth/Throat: Uvula is midline and mucous membranes are normal. No oral lesions. No uvula swelling. Posterior oropharyngeal erythema present. No oropharyngeal exudate, posterior oropharyngeal edema or tonsillar abscesses. No tonsillar exudate.  Eyes: Pupils are equal, round, and reactive to light. Conjunctivae and EOM are normal.  Neck: Normal range of motion. Neck supple.  Cardiovascular: Normal rate, regular rhythm, normal heart sounds and intact distal pulses.  No murmur heard. Pulmonary/Chest: Effort normal and breath sounds normal. No  respiratory distress.  Abdominal: Soft. There is no tenderness.  Musculoskeletal: She exhibits no edema.  Neurological: She is alert.  Skin: Skin is warm and dry. Capillary refill takes less than 2 seconds.  Psychiatric: She has a normal mood and affect.  Nursing note and vitals reviewed.    ED Treatments / Results  Labs (all labs ordered are listed, but only abnormal results are displayed) Labs Reviewed  GROUP A STREP BY PCR    EKG None  Radiology No results found.  Procedures Procedures (including critical care time)  Medications Ordered in ED Medications  dexamethasone (DECADRON) tablet 10 mg (has no administration in time range)     Initial Impression / Assessment and Plan / ED Course  I have reviewed the triage vital signs and the nursing notes.  Pertinent labs & imaging results that were available during my care of the patient were reviewed by me and considered in my medical decision making (see chart for details).    Stacey Cobb is a 22 year old female with no significant medical history who presents to the ED with sore throat.  Patient with normal vitals.  No fever.  Sore throat for the last 2 days.  Has pain with swallowing.  No obvious signs of abscess on exam.  Patient does have some erythema of the posterior pharynx.  Otherwise no signs of ear infection.  Clear breath sounds.  No concern for pneumonia. No concern for PTA/RPA. Likely viral process.  Strep screen negative for strep pharyngitis.  Patient given Decadron.  Recommend close follow-up as needed.  Told to return to the ED if symptoms worsen.  This chart was dictated using voice recognition software.  Despite best efforts to proofread,  errors can occur which can change the documentation meaning.   Final Clinical Impressions(s) / ED Diagnoses   Final diagnoses:  Viral pharyngitis    ED Discharge Orders    None       Virgina Norfolk, DO 03/01/18 1217

## 2018-04-03 ENCOUNTER — Emergency Department (HOSPITAL_COMMUNITY)
Admission: EM | Admit: 2018-04-03 | Discharge: 2018-04-03 | Disposition: A | Discharge status: Home / Self Care | Payer: Medicaid Other | Attending: Emergency Medicine | Admitting: Emergency Medicine

## 2018-04-03 ENCOUNTER — Encounter (HOSPITAL_COMMUNITY): Payer: Self-pay

## 2018-04-03 ENCOUNTER — Other Ambulatory Visit: Payer: Self-pay

## 2018-04-03 DIAGNOSIS — R197 Diarrhea, unspecified: Secondary | ICD-10-CM

## 2018-04-03 DIAGNOSIS — Z87891 Personal history of nicotine dependence: Secondary | ICD-10-CM | POA: Insufficient documentation

## 2018-04-03 DIAGNOSIS — R509 Fever, unspecified: Secondary | ICD-10-CM | POA: Insufficient documentation

## 2018-04-03 DIAGNOSIS — R1084 Generalized abdominal pain: Secondary | ICD-10-CM | POA: Insufficient documentation

## 2018-04-03 LAB — COMPREHENSIVE METABOLIC PANEL
ALK PHOS: 64 U/L (ref 38–126)
ALT: 20 U/L (ref 0–44)
ANION GAP: 9 (ref 5–15)
AST: 18 U/L (ref 15–41)
Albumin: 3.6 g/dL (ref 3.5–5.0)
BUN: 9 mg/dL (ref 6–20)
CO2: 24 mmol/L (ref 22–32)
Calcium: 8.8 mg/dL — ABNORMAL LOW (ref 8.9–10.3)
Chloride: 105 mmol/L (ref 98–111)
Creatinine, Ser: 0.65 mg/dL (ref 0.44–1.00)
GFR calc Af Amer: >60 mL/min (ref >60)
GFR calc non Af Amer: >60 mL/min (ref >60)
Glucose, Bld: 83 mg/dL (ref 70–99)
Potassium: 3.9 mmol/L (ref 3.5–5.1)
Sodium: 138 mmol/L (ref 135–145)
Total Bilirubin: 0.3 mg/dL (ref 0.3–1.2)
Total Protein: 6.6 g/dL (ref 6.5–8.1)

## 2018-04-03 LAB — URINALYSIS, ROUTINE W REFLEX MICROSCOPIC
Bilirubin Urine: NEGATIVE
Glucose, UA: NEGATIVE mg/dL
Ketones, ur: NEGATIVE mg/dL
Leukocytes, UA: NEGATIVE
Nitrite: NEGATIVE
Protein, ur: NEGATIVE mg/dL
Specific Gravity, Urine: 1.021 (ref 1.005–1.030)
pH: 7 (ref 5.0–8.0)

## 2018-04-03 LAB — CBC WITH DIFFERENTIAL/PLATELET
Abs Immature Granulocytes: 0.01 10*3/uL (ref 0.00–0.07)
Basophils Absolute: 0 10*3/uL (ref 0.0–0.1)
Basophils Relative: 0 %
Eosinophils Absolute: 0.2 10*3/uL (ref 0.0–0.5)
Eosinophils Relative: 3 %
HCT: 35.3 % — ABNORMAL LOW (ref 36.0–46.0)
Hemoglobin: 10.9 g/dL — ABNORMAL LOW (ref 12.0–15.0)
Immature Granulocytes: 0 %
Lymphocytes Relative: 42 %
Lymphs Abs: 3 10*3/uL (ref 0.7–4.0)
MCH: 25.9 pg — ABNORMAL LOW (ref 26.0–34.0)
MCHC: 30.9 g/dL (ref 30.0–36.0)
MCV: 83.8 fL (ref 80.0–100.0)
Monocytes Absolute: 0.6 10*3/uL (ref 0.1–1.0)
Monocytes Relative: 8 %
Neutro Abs: 3.4 10*3/uL (ref 1.7–7.7)
Neutrophils Relative %: 47 %
Platelets: 274 10*3/uL (ref 150–400)
RBC: 4.21 MIL/uL (ref 3.87–5.11)
RDW: 13.7 % (ref 11.5–15.5)
WBC: 7.3 10*3/uL (ref 4.0–10.5)
nRBC: 0 % (ref 0.0–0.2)

## 2018-04-03 LAB — LIPASE, BLOOD: Lipase: 28 U/L (ref 11–51)

## 2018-04-03 LAB — I-STAT BETA HCG BLOOD, ED (MC, WL, AP ONLY): I-stat hCG, quantitative: <5 m[IU]/mL (ref <5)

## 2018-04-03 NOTE — ED Notes (Signed)
RN attempted to draw blood was only able to obtain enough for I-stat

## 2018-04-03 NOTE — ED Notes (Signed)
Pt aware of need for urine and stool sample

## 2018-04-03 NOTE — ED Provider Notes (Signed)
[facility]   CSN: [id number] Arrival date & time: 04/03/18  1644  History   Chief Complaint Chief Complaint  Patient presents with  . Fever   HPI Stacey Cobb is a 22 y.o. female with past medical history significant for ovarian cyst who presents for evaluation of fever, abdominal pain and diarrhea. 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 States multiple coworkers have had similar symptoms as well. Symptoms onset 24 hours ago. Denies recent abx use, hx of travel, known CDiff exposure.  Admits to fever, temp max 101, diffuse lower abdominal cramping and non-bloody diarrhea. Abdominal cramping/pain is relieved with bowel moments. Current pain a 5/10. Does not radiate. Denies chest pain, SOB , cough, rhinorrhea, congestion, body aches, dysuria, constipation, rashes, lesions. Has taken tylenol for her fever. Last dose 11am today. Denies additional aggravating or alleviating factors.  History obtained from patient. No interpretor was used.  HPI  Past Medical History:  Diagnosis Date  . Ovarian cyst     There are no active problems to display for this patient.   Past Surgical History:  Procedure Laterality Date  . NO PAST SURGERIES       OB History    Gravida  1   Para      Term      Preterm      AB  1   Living        SAB  1   TAB      Ectopic      Multiple      Live Births               Home Medications    Prior to Admission medications   Medication Sig Start Date End Date Taking? Authorizing Provider  benzonatate (TESSALON) 100 MG capsule Take 1 capsule (100 mg total) by mouth every 8 (eight) hours. 01/31/18   Khatri, Hina, PA-C  cetirizine (ZYRTEC) 5 MG tablet Take 1 tablet (5 mg total) by mouth daily. 01/31/18   Dietrich PatesKhatri, Hina, PA-C    Family History Family History  Problem Relation Age of Onset  . Diabetes Mother   . Hypertension Mother       Social History Social History   Tobacco Use  . Smoking status: Former Smoker    Types: Cigarettes  . Smokeless tobacco: Never Used  Substance Use Topics  . Alcohol use: No  . Drug use: No     Allergies   Banana and Other   Review of Systems Review of Systems  Constitutional: Positive for fever. Negative for activity change, appetite change, chills, diaphoresis, fatigue and unexpected weight change.  HENT: Negative.   Respiratory: Negative.   Cardiovascular: Negative.   Gastrointestinal: Positive for abdominal pain and diarrhea. Negative for abdominal distention, anal bleeding, blood in stool, constipation, nausea, rectal pain and vomiting.  Genitourinary: Negative.   Musculoskeletal: Negative.   Skin: Negative.   Neurological: Negative.   All other systems reviewed and are negative.    Physical Exam Updated Vital Signs BP (!) 140/93 (BP Location: Right Arm)   Pulse 71   Temp 98.4 F (36.9 C) (Oral)   Resp 17   SpO2 100%   Physical Exam Vitals signs and nursing note reviewed.  Constitutional:      General: She is not in acute distress.    Appearance: She is well-developed. She is not ill-appearing, toxic-appearing or diaphoretic.  HENT:  Head: Normocephalic and atraumatic.     Mouth/Throat:     Lips: Pink.     Mouth: Mucous membranes are moist.     Pharynx: Uvula midline.  Eyes:     Pupils: Pupils are equal, round, and reactive to light.  Neck:     Musculoskeletal: Normal range of motion.  Cardiovascular:     Rate and Rhythm: Normal rate.     Heart sounds: Normal heart sounds. No murmur. No friction rub. No gallop.   Pulmonary:     Effort: Pulmonary effort is normal. No tachypnea, accessory muscle usage, prolonged expiration or respiratory distress.     Breath sounds: Normal breath sounds and air entry. No stridor, decreased air movement or transmitted upper airway sounds. No decreased breath sounds, wheezing, rhonchi or rales.  Abdominal:      General: Bowel sounds are normal. There is no distension.     Palpations: Abdomen is soft.     Tenderness: There is abdominal tenderness in the right lower quadrant, suprapubic area and left lower quadrant.     Comments: Mild lower quadrant abdominal tenderness to palpation. No rebound, guardind or rigidity.  Musculoskeletal: Normal range of motion.     Comments: Moves all extremities without difficulty.  Skin:    General: Skin is warm and dry.  Neurological:     Mental Status: She is alert.      ED Treatments / Results  Labs (all labs ordered are listed, but only abnormal results are displayed) Labs Reviewed  CBC WITH DIFFERENTIAL/PLATELET - Abnormal; Notable for the following components:      Result Value   Hemoglobin 10.9 (*)    HCT 35.3 (*)    MCH 25.9 (*)    All other components within normal limits  COMPREHENSIVE METABOLIC PANEL - Abnormal; Notable for the following components:   Calcium 8.8 (*)    All other components within normal limits  URINALYSIS, ROUTINE W REFLEX MICROSCOPIC - Abnormal; Notable for the following components:   Hgb urine dipstick MODERATE (*)    Bacteria, UA RARE (*)    All other components within normal limits  C DIFFICILE QUICK SCREEN W PCR REFLEX  LIPASE, BLOOD  I-STAT BETA HCG BLOOD, ED ([facility], [facility], [facility] ONLY)    EKG None  Radiology No results found.  Procedures Procedures (including critical care time)  Medications Ordered in ED Medications - No data to display   Initial Impression / Assessment and Plan / ED Course  I have reviewed the triage vital signs and the nursing notes.  Pertinent labs & imaging results that were available during my care of the patient were reviewed by me and considered in my medical decision making (see chart for details).  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 Abdomen  non tender to palpation without rebound guarding or rigidity. Afebrile during visit, nonseptic, non-ill appearing. Will obtain labs, urinalysis and reevaluate.  Labs without leukocytosis, lipase 28, metabolic panel without electrolyte, renal or liver abnormalities. Hcg 10.9, no previous to compare. Admits to iron deficiency anemia and not on iron supplementation. Urinalysis negative for infection. Hcg negative. Patient without abdominal pain on reevaluation. Patient unable to provide stool specimen during ED visit. No know Cdiff exposure.  Patient is nontoxic, nonseptic appearing, in no apparent distress.  Patient's pain and other symptoms adequately managed in emergency department.  Labs, imaging and vitals reviewed.  Patient does not meet the SIRS or Sepsis criteria.  On repeat exam patient does not have a surgical abdomin and there are no peritoneal signs.  No indication of appendicitis, bowel obstruction, bowel perforation, cholecystitis, diverticulitis, PID or ectopic pregnancy.  Patient discharged home with symptomatic treatment and given strict instructions for follow-up with their primary care physician. Patient able to tolerate PO intake in department without difficulty. I have also discussed reasons to return immediately to the ER.  Patient expresses understanding and agrees with plan.    Final Clinical Impressions(s) / ED Diagnoses   Final diagnoses:  Diarrhea, unspecified type  Generalized abdominal pain    ED Discharge Orders    None       Henderly, Britni A, PA-C 04/03/18 2246    Loren RacerYelverton, David, MD 04/04/18 1844

## 2018-04-03 NOTE — Discharge Instructions (Addendum)
You were evaluated today for abdominal pain.  Your labs were negative.  Your urinalysis is negative for infection.  This is likely a viral illness.  You may continue taking Tylenol as needed for your fever.  You may also may take Imodium for your diarrhea.  Follow-up with your PCP for reevaluation if you continue to have symptoms.  Return to the ED for any new or worsening symptoms.

## 2018-04-03 NOTE — ED Notes (Signed)
Patient verbalizes understanding of discharge instructions. Opportunity for questioning and answers were provided. Armband removed by staff, pt discharged from ED ambulatory.   

## 2018-04-03 NOTE — ED Triage Notes (Signed)
Pt from home with complaints of a fever and loose stools for 24 hrs.  Treated fevers with tylenol at home.  Last dose of tylenol at 11am. A&Ox4, no cough or emesis.

## 2018-04-03 NOTE — ED Notes (Signed)
Phlebotomy at bedside.

## 2018-06-10 ENCOUNTER — Ambulatory Visit (HOSPITAL_COMMUNITY)
Admission: EM | Admit: 2018-06-10 | Discharge: 2018-06-10 | Disposition: A | Discharge status: Home / Self Care | Payer: Medicaid Other | Attending: Family Medicine | Admitting: Family Medicine

## 2018-06-10 ENCOUNTER — Other Ambulatory Visit: Payer: Self-pay

## 2018-06-10 ENCOUNTER — Encounter (HOSPITAL_COMMUNITY): Payer: Self-pay

## 2018-06-10 DIAGNOSIS — K529 Noninfective gastroenteritis and colitis, unspecified: Secondary | ICD-10-CM

## 2018-06-10 DIAGNOSIS — R197 Diarrhea, unspecified: Principal | ICD-10-CM

## 2018-06-10 DIAGNOSIS — R112 Nausea with vomiting, unspecified: Secondary | ICD-10-CM

## 2018-06-10 DIAGNOSIS — J029 Acute pharyngitis, unspecified: Secondary | ICD-10-CM

## 2018-06-10 MED ORDER — ONDANSETRON 4 MG PO TBDP
4.0000 mg | ORAL_TABLET | Freq: Once | ORAL | Status: AC
  Administered 2018-06-10: 4 mg via ORAL

## 2018-06-10 MED ORDER — ONDANSETRON HCL 4 MG PO TABS: 4.0000 mg | ORAL_TABLET | Freq: Four times a day (QID) | ORAL | 0 refills | Status: DC

## 2018-06-10 MED ORDER — ONDANSETRON 4 MG PO TBDP
ORAL_TABLET | ORAL | Status: AC
  Filled 2018-06-10: qty 1

## 2018-06-10 NOTE — Discharge Instructions (Signed)
Zofran given in office Get rest and drink fluids Zofran prescribed.  Take as directed.    DIET Instructions:  30 minutes after taking nausea medicine, begin with sips of clear liquids. If able to hold down 2 - 4 ounces for 30 minutes, begin drinking more. Increase your fluid intake to replace losses. Clear liquids only for 24 hours (water, tea, sport drinks, clear flat ginger ale or cola and juices, broth, jello, popsicles, ect). Advance to bland foods, applesauce, rice, baked or boiled chicken, ect. Avoid milk, greasy foods and anything that doesnt agree with you.  If you experience new or worsening symptoms return or go to ER such as fever, chills, nausea, vomiting, diarrhea, bloody or dark tarry stools, constipation, urinary symptoms, worsening abdominal discomfort, symptoms that do not improve with medications, inability to keep fluids down, etc...  Use OTC zyrtec and/or flonase as needed for sore throat.  Return or follow up with PCP if sore throat persists or worsens, or if you experience nasal congestion, runny nose, fever, chills, fatigue, etc..Marland Kitchen

## 2018-06-10 NOTE — ED Provider Notes (Signed)
[facility] CARE CENTER   [id number] 06/10/18 Arrival Time: 1223  CC: NVD and sore throat  SUBJECTIVE:  Stacey Cobb is a 23 y.o. female who presents with nausea, vomiting x 8 episodes, and persistent diarrhea that began approximately 12 hours ago.  Admits to eating old food last night.  Complains of associated abdominal soreness.  Has not tried OTC medications.  Symptoms made worse with eating, but keeping fluids down without difficulty. Reports similar symptoms in the past.    Denies fever, chills, chest pain, SOB, constipation, hematochezia, melena, dysuria, difficulty urinating, increased frequency or urgency, flank pain, loss of bowel or bladder function.   Pt also mentions mild sore throat x 1 day.  Denies strep contacts.  Has not tried OTC medications.  Worse with swallowing, but tolerating liquids and secretions.  Reports previous symptoms and diagnosed with strep throat.  Denies fever.    No LMP recorded. (Menstrual status: Irregular Periods).  ROS: As per HPI.  Past Medical History:  Diagnosis Date  . Ovarian cyst    Past Surgical History:  Procedure Laterality Date  . NO PAST SURGERIES     Allergies  Allergen Reactions  . Banana Anaphylaxis and Hives  . Other Anaphylaxis and Hives    Sesame seeds, oil, etc   No current facility-administered medications on file prior to encounter.    No current outpatient medications on file prior to encounter.   Social History   Socioeconomic History  . Marital status: Single    Spouse name: Not on file  . Number of children: Not on file  . Years of education: Not on file  . Highest education level: Not on file  Occupational History  . Not on file  Social Needs  . Financial resource strain: Not on file  . Food insecurity:    Worry: Not on file    Inability: Not on file  . Transportation needs:    Medical: Not on file    Non-medical: Not on file  Tobacco Use  . Smoking status: Former Smoker    Types: Cigarettes  .  Smokeless tobacco: Never Used  Substance and Sexual Activity  . Alcohol use: No  . Drug use: No  . Sexual activity: Yes    Birth control/protection: None  Lifestyle  . Physical activity:    Days per week: Not on file    Minutes per session: Not on file  . Stress: Not on file  Relationships  . Social connections:    Talks on phone: Not on file    Gets together: Not on file    Attends religious service: Not on file    Active member of club or organization: Not on file    Attends meetings of clubs or organizations: Not on file    Relationship status: Not on file  . Intimate partner violence:    Fear of current or ex partner: Not on file    Emotionally abused: Not on file    Physically abused: Not on file    Forced sexual activity: Not on file  Other Topics Concern  . Not on file  Social History Narrative  . Not on file   Family History  Problem Relation Age of Onset  . Diabetes Mother   . Hypertension Mother      OBJECTIVE:  Vitals:   06/10/18 1242  BP: 134/77  Pulse: (!) 102  Resp: 18  Temp: 98.9 F (37.2 C)  TempSrc: Tympanic  SpO2: 95%    General appearance:  Alert; mildly fatigue appearing HEENT: NCAT.  EACs clear, TMs pearly gray.  Oropharynx clear. Tonsils not enlarged or erythematous without white exudates, uvula midline Lungs: clear to auscultation bilaterally without adventitious breath sounds Heart: regular rate and rhythm.  Radial pulses 2+ symmetrical bilaterally Abdomen: soft, non-distended; normal active bowel sounds; non-tender to light and deep palpation; nontender at McBurney's point; negative Murphy's sign; no guarding Extremities: no edema; symmetrical with no gross deformities Skin: warm and dry Neurologic: normal gait Psychological: alert and cooperative; normal mood and affect  ASSESSMENT & PLAN:  1. Nausea vomiting and diarrhea   2. Gastroenteritis   3. Sore throat     Meds ordered this encounter  Medications  . ondansetron  (ZOFRAN-ODT) disintegrating tablet 4 mg  . ondansetron (ZOFRAN) 4 MG tablet    Sig: Take 1 tablet (4 mg total) by mouth every 6 (six) hours.    Dispense:  12 tablet    Refill:  0    Order Specific Question:   Supervising Provider    Answer:   Eustace Moore [[id number]]    Zofran given in office Get rest and drink fluids Zofran prescribed.  Take as directed.    DIET Instructions:  30 minutes after taking nausea medicine, begin with sips of clear liquids. If able to hold down 2 - 4 ounces for 30 minutes, begin drinking more. Increase your fluid intake to replace losses. Clear liquids only for 24 hours (water, tea, sport drinks, clear flat ginger ale or cola and juices, broth, jello, popsicles, ect). Advance to bland foods, applesauce, rice, baked or boiled chicken, ect. Avoid milk, greasy foods and anything that doesn't agree with you.  If you experience new or worsening symptoms return or go to ER such as fever, chills, nausea, vomiting, diarrhea, bloody or dark tarry stools, constipation, urinary symptoms, worsening abdominal discomfort, symptoms that do not improve with medications, inability to keep fluids down, etc...  Use OTC zyrtec and/or flonase as needed for sore throat.  Return or follow up with PCP if sore throat persists or worsens, or if you experience nasal congestion, runny nose, fever, chills, fatigue, etc...  Reviewed expectations re: course of current medical issues. Questions answered. Outlined signs and symptoms indicating need for more acute intervention. Patient verbalized understanding. After Visit Summary given.   Rennis Harding, PA-C 06/10/18 1331

## 2018-06-10 NOTE — ED Triage Notes (Signed)
Pt presents today with nausea, vomiting and diarrhea that has been going on since 1am this morning. Has also had a sore throat that has been going on for a day and a half. Works in an assisted living facility.

## 2018-08-14 ENCOUNTER — Other Ambulatory Visit: Payer: Self-pay

## 2018-08-14 ENCOUNTER — Ambulatory Visit (HOSPITAL_COMMUNITY)
Admission: EM | Admit: 2018-08-14 | Discharge: 2018-08-14 | Disposition: A | Discharge status: Home / Self Care | Payer: Medicaid Other | Attending: Family Medicine | Admitting: Family Medicine

## 2018-08-14 ENCOUNTER — Encounter (HOSPITAL_COMMUNITY): Payer: Self-pay

## 2018-08-14 DIAGNOSIS — Z0289 Encounter for other administrative examinations: Secondary | ICD-10-CM

## 2018-08-14 DIAGNOSIS — R509 Fever, unspecified: Secondary | ICD-10-CM

## 2018-08-14 DIAGNOSIS — Z711 Person with feared health complaint in whom no diagnosis is made: Secondary | ICD-10-CM

## 2018-08-14 NOTE — ED Provider Notes (Signed)
[facility]    CSN: [id number] Arrival date & time: 08/14/18  1819     History   Chief Complaint Chief Complaint  Patient presents with  . work note    HPI Stacey Cobb is a 23 y.o. female.   23 year old female comes in for work note.  States 3 to 4 days ago had a temperature of 99. Fiance told work "I had a slight fever" and "now they won't let me go back to work unless I see someone". She feels fatigued, but thinks it is due to working double shifts. Denies fever, chills, night sweats.  T-max 99.  Denies cough, congestion, rhinorrhea, sore throat.  Denies body aches, abdominal pain, nausea, vomiting.  She has not had any known contact with COVID. No antipyretic in the last 8 hours.      Past Medical History:  Diagnosis Date  . Ovarian cyst     There are no active problems to display for this patient.   Past Surgical History:  Procedure Laterality Date  . NO PAST SURGERIES      OB History    Gravida  1   Para      Term      Preterm      AB  1   Living        SAB  1   TAB      Ectopic      Multiple      Live Births               Home Medications    Prior to Admission medications   Medication Sig Start Date End Date Taking? Authorizing Provider  ondansetron (ZOFRAN) 4 MG tablet Take 1 tablet (4 mg total) by mouth every 6 (six) hours. 06/10/18   Rennis HardingWurst, Brittany, PA-C    Family History Family History  Problem Relation Age of Onset  . Diabetes Mother   . Hypertension Mother     Social History Social History   Tobacco Use  . Smoking status: Former Smoker    Types: Cigarettes  . Smokeless tobacco: Never Used  Substance Use Topics  . Alcohol use: No  . Drug use: No     Allergies   Banana and Other   Review of Systems Review of Systems  Reason unable to perform ROS: See HPI as above.     Physical Exam Triage Vital Signs ED Triage Vitals  Enc Vitals Group     BP 08/14/18 1844 118/72     Pulse Rate 08/14/18  1844 83     Resp 08/14/18 1844 18     Temp 08/14/18 1844 98.8 F (37.1 C)     Temp Source 08/14/18 1844 Oral     SpO2 08/14/18 1844 98 %     Weight 08/14/18 1842 276 lb (125.2 kg)     Height --      Head Circumference --      Peak Flow --      Pain Score 08/14/18 1842 1     Pain Loc --      Pain Edu? --      Excl. in GC? --    No data found.  Updated Vital Signs BP 118/72 (BP Location: Left Arm)   Pulse 83   Temp 98.8 F (37.1 C) (Oral)   Resp 18   Wt 276 lb (125.2 kg)   SpO2 98%   BMI 48.89 kg/m   Physical Exam Constitutional:  General: She is not in acute distress.    Appearance: She is well-developed. She is not diaphoretic.  HENT:     Head: Normocephalic and atraumatic.  Eyes:     Conjunctiva/sclera: Conjunctivae normal.     Pupils: Pupils are equal, round, and reactive to light.  Neurological:     Mental Status: She is alert and oriented to person, place, and time.      UC Treatments / Results  Labs (all labs ordered are listed, but only abnormal results are displayed) Labs Reviewed - No data to display  EKG None  Radiology No results found.  Procedures Procedures (including critical care time)  Medications Ordered in UC Medications - No data to display  Initial Impression / Assessment and Plan / UC Course  I have reviewed the triage vital signs and the nursing notes.  Pertinent labs & imaging results that were available during my care of the patient were reviewed by me and considered in my medical decision making (see chart for details).    Normal exam.  Patient asymptomatic.  Has not had a fever with T-max of 99.  Afebrile without antipyretics in the last 8 hours. Will provide work note to return to work.  Final Clinical Impressions(s) / UC Diagnoses   Final diagnoses:  Worried well    ED Prescriptions    None        Lurline Idol 08/14/18 1907

## 2018-08-14 NOTE — Discharge Instructions (Signed)
Normal exam. Can return to work.

## 2018-08-14 NOTE — ED Triage Notes (Signed)
Pt states she went to work her temp was up. So she need a work note to return to work.

## 2018-11-16 ENCOUNTER — Emergency Department (HOSPITAL_COMMUNITY): Payer: Self-pay

## 2018-11-16 ENCOUNTER — Telehealth: Payer: Self-pay | Admitting: *Deleted

## 2018-11-16 ENCOUNTER — Other Ambulatory Visit: Payer: Self-pay

## 2018-11-16 ENCOUNTER — Encounter (HOSPITAL_COMMUNITY): Payer: Self-pay | Admitting: Student

## 2018-11-16 ENCOUNTER — Emergency Department (HOSPITAL_COMMUNITY)
Admission: EM | Admit: 2018-11-16 | Discharge: 2018-11-16 | Disposition: A | Discharge status: Home / Self Care | Payer: Self-pay | Attending: Emergency Medicine | Admitting: Emergency Medicine

## 2018-11-16 DIAGNOSIS — M545 Low back pain: Secondary | ICD-10-CM | POA: Insufficient documentation

## 2018-11-16 DIAGNOSIS — Y999 Unspecified external cause status: Secondary | ICD-10-CM | POA: Insufficient documentation

## 2018-11-16 DIAGNOSIS — Z87891 Personal history of nicotine dependence: Secondary | ICD-10-CM | POA: Insufficient documentation

## 2018-11-16 DIAGNOSIS — M25522 Pain in left elbow: Secondary | ICD-10-CM | POA: Insufficient documentation

## 2018-11-16 DIAGNOSIS — W19XXXA Unspecified fall, initial encounter: Secondary | ICD-10-CM

## 2018-11-16 DIAGNOSIS — Y93E1 Activity, personal bathing and showering: Secondary | ICD-10-CM | POA: Insufficient documentation

## 2018-11-16 DIAGNOSIS — W1830XA Fall on same level, unspecified, initial encounter: Secondary | ICD-10-CM | POA: Insufficient documentation

## 2018-11-16 DIAGNOSIS — M7918 Myalgia, other site: Secondary | ICD-10-CM

## 2018-11-16 DIAGNOSIS — M25512 Pain in left shoulder: Secondary | ICD-10-CM | POA: Insufficient documentation

## 2018-11-16 DIAGNOSIS — M25552 Pain in left hip: Secondary | ICD-10-CM | POA: Insufficient documentation

## 2018-11-16 DIAGNOSIS — M546 Pain in thoracic spine: Secondary | ICD-10-CM | POA: Insufficient documentation

## 2018-11-16 DIAGNOSIS — R0789 Other chest pain: Secondary | ICD-10-CM | POA: Insufficient documentation

## 2018-11-16 DIAGNOSIS — Y92031 Bathroom in apartment as the place of occurrence of the external cause: Secondary | ICD-10-CM | POA: Insufficient documentation

## 2018-11-16 LAB — POC URINE PREG, ED: Preg Test, Ur: NEGATIVE

## 2018-11-16 MED ORDER — METHOCARBAMOL 500 MG PO TABS: 500.0000 mg | ORAL_TABLET | Freq: Every evening | ORAL | 0 refills | Status: DC | PRN

## 2018-11-16 MED ORDER — NAPROXEN 500 MG PO TABS: 500.0000 mg | ORAL_TABLET | Freq: Two times a day (BID) | ORAL | 0 refills | Status: DC

## 2018-11-16 MED ORDER — OXYCODONE-ACETAMINOPHEN 5-325 MG PO TABS
1.0000 | ORAL_TABLET | Freq: Once | ORAL | Status: AC
  Administered 2018-11-16: 1 via ORAL
  Filled 2018-11-16: qty 1

## 2018-11-16 NOTE — ED Provider Notes (Signed)
  Physical Exam  BP 123/75   Pulse 76   Temp 97.9 F (36.6 C) (Oral)   Resp 19   SpO2 100%   Physical Exam   Gen: appears nontoxic  ED Course/Procedures     Procedures  MDM   pt signed out to me by Annetta Maw, PA-C. Please see previous notes for further history.    In brief, patient presenting for evaluation after mechanical fall.  Fell onto her left side.  Imaging pending.  X-rays viewed interpreted by me, no fractures or dislocations.  Patient has been ambulatory in the ED without difficulty.  Discussed symptomatic treatment for musculoskeletal pain including NSAIDs and muscle relaxers as needed.  At this time, patient appears safe for discharge.  Return precautions given.  Patient states she understands and agrees to plan.      Franchot Heidelberg, PA-C 11/16/18 7412    Tegeler, Gwenyth Allegra, MD 11/16/18 [phone]

## 2018-11-16 NOTE — ED Triage Notes (Addendum)
Pt BIB [facility] after sustaining a mechanical fall at home this morning. Pt reports slipping in shower landing on left side. No LOC. Patient immediately felt 10/10 pain to the area around her left breast radiating to the left hip.

## 2018-11-16 NOTE — ED Notes (Signed)
Discharge instructions and prescriptions discussed with Pt. Pt verbalized understanding. Pt stable and ambulatory.   

## 2018-11-16 NOTE — Discharge Instructions (Signed)
Take naproxen 2 times a day with meals.  Do not take other anti-inflammatories at the same time (Advil, Motrin, ibuprofen, Aleve). You may supplement with Tylenol if you need further pain control. Use robaxin as needed for muscle stiffness or soreness.  Have caution, this may make you tired or groggy.  Do not drive or operate heavy machinery while taking this medicine. Use ice packs or heating pads if this helps control your pain. Use muscle creams (salonpas, icy hot, bengay, biofreeze) for pain control.  You will likely have continued muscle stiffness and soreness over the next couple days.  Follow-up with primary care in 1 week if your symptoms are not improving. Return to the emergency room if you develop vision changes, vomiting, slurred speech, numbness, loss of bowel or bladder control, or any new or worsening symptoms.

## 2018-11-16 NOTE — ED Provider Notes (Signed)
[facility] Provider Note   CSN: [id number] Arrival date & time: 11/16/18  0550     History   Chief Complaint Chief Complaint  Patient presents with  . Fall    HPI Stacey Cobb is a 23 y.o. female without significant past medical hx who presents to the ED via EMS s/p mechanical fall just PTA. Patient states that she was in the shower, she was holding onto a bar when it broke causing her to fall to her left side. She states she hit her head but did not lose consciousness. She relays she landed on her L ribs/hip. She felt she was unable to get up on her own therefore she called 911.  She states she is having pain to the LUE, L ribs, L hip, & mid to lower back. Worse with movement, no alleviating factors. No intervention PTA. Has weighbeared since the injury. Denies prodromal sxs, states was simply mechanical. Denies visual disturbance, numbness, weakness, syncope, seizure activity, vomiting, dyspnea, hemoptysis, or abdominal pain. She states she does not take any blood thinners.      HPI  Past Medical History:  Diagnosis Date  . Ovarian cyst     There are no active problems to display for this patient.   Past Surgical History:  Procedure Laterality Date  . NO PAST SURGERIES       OB History    Gravida  1   Para      Term      Preterm      AB  1   Living        SAB  1   TAB      Ectopic      Multiple      Live Births               Home Medications    Prior to Admission medications   Medication Sig Start Date End Date Taking? Authorizing Provider  ondansetron (ZOFRAN) 4 MG tablet Take 1 tablet (4 mg total) by mouth every 6 (six) hours. 06/10/18   Rennis HardingWurst, Brittany, PA-C    Family History Family History  Problem Relation Age of Onset  . Diabetes Mother   . Hypertension Mother     Social History Social History   Tobacco Use  . Smoking status: Former Smoker    Types: Cigarettes  . Smokeless tobacco:  Never Used  Substance Use Topics  . Alcohol use: No  . Drug use: No     Allergies   Banana and Other   Review of Systems Review of Systems  Constitutional: Negative for chills and fever.  Respiratory: Negative for shortness of breath.        Negative for hemoptysis.   Musculoskeletal: Positive for arthralgias, back pain and myalgias. Negative for neck pain.  Skin: Negative for wound.  Neurological: Negative for dizziness, seizures, syncope, weakness, light-headedness, numbness and headaches.  All other systems reviewed and are negative.    Physical Exam Updated Vital Signs BP 118/72   Pulse 84   Temp 97.9 F (36.6 C) (Oral)   Resp (!) 22   SpO2 100%   Physical Exam Vitals signs and nursing note reviewed.  Constitutional:      General: She is not in acute distress.    Appearance: She is well-developed.  HENT:     Head: Normocephalic and atraumatic. No raccoon eyes or Battle's sign.     Right Ear: No hemotympanum.     Left  Ear: No hemotympanum.     Nose: Nose normal.     Mouth/Throat:     Mouth: Mucous membranes are moist.  Eyes:     General:        Right eye: No discharge.        Left eye: No discharge.     Extraocular Movements: Extraocular movements intact.     Conjunctiva/sclera: Conjunctivae normal.     Pupils: Pupils are equal, round, and reactive to light.  Neck:     Musculoskeletal: Normal range of motion. No spinous process tenderness or muscular tenderness.  Cardiovascular:     Rate and Rhythm: Normal rate and regular rhythm.     Heart sounds: No murmur.     Comments: 2+ symmetric radial and DP/PT pulses. Pulmonary:     Effort: Pulmonary effort is normal. No respiratory distress.     Breath sounds: Normal breath sounds. No wheezing or rales.  Chest:     Chest wall: Tenderness (left anterior lower ribs without overlying skin changes or palpable crepitus. ) present.  Abdominal:     General: There is no distension.     Palpations: Abdomen is soft.      Tenderness: There is no abdominal tenderness. There is no guarding or rebound.  Musculoskeletal:     Comments: No obvious deformity, appreciable swelling, erythema, ecchymosis, or significant open wounds Upper extremities: Patient has intact active range of motion throughout with the exception of mild limitation of left shoulder flexion which he states is secondary to pain.  She is able to flex to about 90 degrees.  Patient is tender diffusely throughout the glenohumeral joint as well as to the proximal one third of the humerus in the posterior left elbow.  No point/focal tenderness.  Upper extremities are otherwise nontender.  No anatomical snuffbox tenderness Back: Patient diffusely tender throughout the thoracic and lumbar region without point/focal vertebral tenderness or palpable step-off Lower extremities: Patient has intact active range of motion throughout with the exception of limitation at the left hip and the left knee.  She is able to lift her left lower extremity off of the stretcher somewhat regarding hip flexion, able to flex the left knee to about 90 degrees.  She states that her left knee/hip range of motion is limited secondary to pain in her left hip.  Patient has tenderness palpation diffusely about the hip anterior, laterally, and posteriorly.  Lower extremities are otherwise nontender.  Skin:    General: Skin is warm and dry.     Capillary Refill: Capillary refill takes less than 2 seconds.     Findings: No rash.  Neurological:     Comments: Alert.  Clear speech.  CN III through XII grossly intact.  Sensation grossly intact bilateral upper and lower extremities.  5 out of 5 symmetric grip strength.  5 out of 5 strength with plantar dorsiflexion bilaterally.  Patient is ambulatory with an antalgic gait, no obvious foot drop.  Psychiatric:        Behavior: Behavior normal.    ED Treatments / Results  Labs (all labs ordered are listed, but only abnormal results are displayed)  Labs Reviewed  POC URINE PREG, ED    EKG None  Radiology No results found.  Procedures Procedures (including critical care time)  Medications Ordered in ED Medications  oxyCODONE-acetaminophen (PERCOCET/ROXICET) 5-325 MG per tablet 1 tablet (has no administration in time range)     Initial Impression / Assessment and Plan / ED Course  I have  reviewed the triage vital signs and the nursing notes.  Pertinent labs & imaging results that were available during my care of the patient were reviewed by me and considered in my medical decision making (see chart for details).   Patient presents to the emergency department status post mechanical fall with complaints of pain to the left side of her body.  She is nontoxic-appearing, no apparent distress, vitals WNL with exception of somewhat elevated blood pressure, doubt HTN emergency.   [place] CT head injury/trauma rule and C-spine rule suggest no imaging required.  Proceed w/ [facility] x-rays based on exam, NVI distally to all areas of pain. Percocet ordered for pain control.   07:00: Patient care [facility] PA-C @ change of shift pending imaging & disposition.   Final Clinical Impressions(s) / ED Diagnoses   Final diagnoses:  None    ED Discharge Orders    None       Cherly Andersonetrucelli, Samantha R, PA-C 11/16/18 0655    Dione BoozeGlick, David, MD 11/16/18 [phone]

## 2018-11-16 NOTE — ED Notes (Signed)
Patient transported to X-ray 

## 2018-11-16 NOTE — Telephone Encounter (Signed)
TOC CM received call from pt to call in Rx. Called in Rx to [facility]. Pt will pick up. Jonnie Finner RN CCM Case Mgmt phone [phone]

## 2018-11-19 ENCOUNTER — Telehealth: Payer: Self-pay

## 2018-11-19 NOTE — Telephone Encounter (Signed)
Lm on VM for pt to call back. [phone].

## 2018-11-19 NOTE — Telephone Encounter (Signed)
Pt called back and was advised: Instructed pt during encounter with the [facility] Radiology Department, their may have been exposure to an unknown GI illness. Informed that it is suspected to be a food borne illness that can be transmitted person to person, but informed we are still investigating. Pt advised if they develop nausea, vomiting, or diarrhea in the next day or two to call [phone] from 7 am to 7 pm. Pt advised if they develop severe symptoms, please seek medical attention. Pt advised they are not at high risk of getting this illness and informed pt that testing is not recommended for asymptomatic patients. Pt is asymptomatic.

## 2019-03-26 ENCOUNTER — Emergency Department (HOSPITAL_COMMUNITY): Payer: Self-pay

## 2019-03-26 ENCOUNTER — Other Ambulatory Visit: Payer: Self-pay

## 2019-03-26 ENCOUNTER — Encounter (HOSPITAL_COMMUNITY): Payer: Self-pay | Admitting: Emergency Medicine

## 2019-03-26 ENCOUNTER — Emergency Department (HOSPITAL_COMMUNITY)
Admission: EM | Admit: 2019-03-26 | Discharge: 2019-03-26 | Disposition: A | Discharge status: Home / Self Care | Payer: Self-pay | Attending: Emergency Medicine | Admitting: Emergency Medicine

## 2019-03-26 ENCOUNTER — Telehealth: Payer: Self-pay | Admitting: *Deleted

## 2019-03-26 DIAGNOSIS — U071 COVID-19: Secondary | ICD-10-CM | POA: Insufficient documentation

## 2019-03-26 DIAGNOSIS — Z87891 Personal history of nicotine dependence: Secondary | ICD-10-CM | POA: Insufficient documentation

## 2019-03-26 DIAGNOSIS — J069 Acute upper respiratory infection, unspecified: Secondary | ICD-10-CM

## 2019-03-26 LAB — BASIC METABOLIC PANEL
Anion gap: 10 (ref 5–15)
BUN: 7 mg/dL (ref 6–20)
CO2: 23 mmol/L (ref 22–32)
Calcium: 8.4 mg/dL — ABNORMAL LOW (ref 8.9–10.3)
Chloride: 104 mmol/L (ref 98–111)
Creatinine, Ser: 0.79 mg/dL (ref 0.44–1.00)
GFR calc Af Amer: >60 mL/min (ref >60)
GFR calc non Af Amer: >60 mL/min (ref >60)
Glucose, Bld: 93 mg/dL (ref 70–99)
Potassium: 3.7 mmol/L (ref 3.5–5.1)
Sodium: 137 mmol/L (ref 135–145)

## 2019-03-26 LAB — CBC
HCT: 34.3 % — ABNORMAL LOW (ref 36.0–46.0)
Hemoglobin: 11.1 g/dL — ABNORMAL LOW (ref 12.0–15.0)
MCH: 26.6 pg (ref 26.0–34.0)
MCHC: 32.4 g/dL (ref 30.0–36.0)
MCV: 82.3 fL (ref 80.0–100.0)
Platelets: 251 10*3/uL (ref 150–400)
RBC: 4.17 MIL/uL (ref 3.87–5.11)
RDW: 13.2 % (ref 11.5–15.5)
WBC: 3.5 10*3/uL — ABNORMAL LOW (ref 4.0–10.5)
nRBC: 0 % (ref 0.0–0.2)

## 2019-03-26 LAB — I-STAT BETA HCG BLOOD, ED (MC, WL, AP ONLY): I-stat hCG, quantitative: <5 m[IU]/mL (ref <5)

## 2019-03-26 LAB — TROPONIN I (HIGH SENSITIVITY)
Troponin I (High Sensitivity): 3 ng/L (ref <18)
Troponin I (High Sensitivity): <2 ng/L (ref <18)

## 2019-03-26 LAB — INFLUENZA PANEL BY PCR (TYPE A & B)
Influenza A By PCR: NEGATIVE
Influenza B By PCR: NEGATIVE

## 2019-03-26 LAB — SARS CORONAVIRUS 2 (TAT 6-24 HRS): SARS Coronavirus 2: POSITIVE — AB

## 2019-03-26 MED ORDER — SODIUM CHLORIDE 0.9% FLUSH: 3.0000 mL | Freq: Once | INTRAVENOUS | Status: DC

## 2019-03-26 MED ORDER — ACETAMINOPHEN 500 MG PO TABS: 1000.0000 mg | ORAL_TABLET | Freq: Once | ORAL | Status: DC

## 2019-03-26 MED ORDER — ACETAMINOPHEN-CODEINE 120-12 MG/5ML PO SOLN: 10.0000 mL | ORAL | 0 refills | Status: DC | PRN

## 2019-03-26 MED ORDER — AEROCHAMBER PLUS FLO-VU MEDIUM MISC
1.0000 | Freq: Once | Status: DC
  Filled 2019-03-26: qty 1

## 2019-03-26 MED ORDER — GUAIFENESIN ER 1200 MG PO TB12: 1.0000 | ORAL_TABLET | Freq: Two times a day (BID) | ORAL | 0 refills | Status: AC

## 2019-03-26 MED ORDER — ACETAMINOPHEN-CODEINE 120-12 MG/5ML PO SOLN: 10.0000 mL | ORAL | 0 refills | Status: AC | PRN

## 2019-03-26 MED ORDER — ALBUTEROL SULFATE HFA 108 (90 BASE) MCG/ACT IN AERS: 2.0000 | INHALATION_SPRAY | RESPIRATORY_TRACT | Status: DC | PRN

## 2019-03-26 NOTE — ED Provider Notes (Signed)
[facility] EMERGENCY DEPARTMENT Provider Note   CSN: [id number] Arrival date & time: 03/26/19  9924     History No chief complaint on file.   Stacey Cobb is a 23 y.o. female.  HPI Patient presents to the emergency department with cough, fever and generalized body aches.  The patient states he did a little bit of diarrhea as well.  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  Patient states that she took some ibuprofen for the fever.  The patient states that she feels like she has to take a deep breath in order to speak normally.  The patient denies  headache,blurred vision, neck pain,  weakness, numbness, dizziness, anorexia, edema, abdominal pain, nausea, vomiting, diarrhea, rash, back pain, dysuria, hematemesis, bloody stool, near syncope, or syncope.    Past Medical History:  Diagnosis Date  . Ovarian cyst     There are no problems to display for this patient.   Past Surgical History:  Procedure Laterality Date  . NO PAST SURGERIES       OB History    Gravida  1   Para      Term      Preterm      AB  1   Living        SAB  1   TAB      Ectopic      Multiple      Live Births              Family History  Problem Relation Age of Onset  . Diabetes Mother   . Hypertension Mother     Social History   Tobacco Use  . Smoking status: Former Smoker    Types: Cigarettes  . Smokeless tobacco: Never Used  Substance Use Topics  . Alcohol use: No  . Drug use: No    Home Medications Prior to Admission medications   Not on File    Allergies    Banana and Other  Review of Systems   Review of Systems All other systems negative except as documented in the HPI. All pertinent positives and negatives as reviewed in the HPI. Physical Exam Updated Vital Signs BP 113/65   Pulse 93   Temp 99.9 F (37.7 C)   Resp 11   Ht 5\' 2"  (1.575 m)   Wt 120.2 kg   SpO2 96%   BMI 48.47  kg/m   Physical Exam Vitals and nursing note reviewed.  Constitutional:      General: She is not in acute distress.    Appearance: She is well-developed.  HENT:     Head: Normocephalic and atraumatic.  Eyes:     Pupils: Pupils are equal, round, and reactive to light.  Cardiovascular:     Rate and Rhythm: Normal rate and regular rhythm.     Heart sounds: Normal heart sounds. No murmur. No friction rub. No gallop.   Pulmonary:     Effort: Pulmonary effort is normal. No respiratory distress.     Breath sounds: Normal breath sounds. No wheezing.  Abdominal:     General: Bowel sounds are normal. There is no distension.     Palpations: Abdomen is soft.     Tenderness: There is no abdominal tenderness.  Musculoskeletal:     Cervical back: Normal range of motion and neck supple.  Skin:    General: Skin is warm and dry.  Capillary Refill: Capillary refill takes less than 2 seconds.     Findings: No erythema or rash.  Neurological:     Mental Status: She is alert and oriented to person, place, and time.     Motor: No abnormal muscle tone.     Coordination: Coordination normal.  Psychiatric:        Behavior: Behavior normal.     ED Results / Procedures / Treatments   Labs (all labs ordered are listed, but only abnormal results are displayed) Labs Reviewed  BASIC METABOLIC PANEL - Abnormal; Notable for the following components:      Result Value   Calcium 8.4 (*)    All other components within normal limits  CBC - Abnormal; Notable for the following components:   WBC 3.5 (*)    Hemoglobin 11.1 (*)    HCT 34.3 (*)    All other components within normal limits  SARS CORONAVIRUS 2 (TAT 6-24 HRS)  INFLUENZA PANEL BY PCR (TYPE A & B)  I-STAT BETA HCG BLOOD, ED ([facility], [facility], [facility] ONLY)  TROPONIN I (HIGH SENSITIVITY)  TROPONIN I (HIGH SENSITIVITY)    EKG None  Radiology DG Chest Port 1 View  Result Date: 03/26/2019 CLINICAL DATA:  Fever EXAM: PORTABLE CHEST 1 VIEW COMPARISON:   01/31/2018 FINDINGS: Low volume chest with indistinct patchy pulmonary opacity at the bases. Generous heart size with normal upper mediastinal contours. No effusion or pneumothorax IMPRESSION: Low volume chest with suspected patchy pneumonia at the bases. Electronically Signed   By: Monte Fantasia M.D.   On: 03/26/2019 10:48    Procedures Procedures (including critical care time)  Medications Ordered in ED Medications  sodium chloride flush (NS) 0.9 % injection 3 mL (3 mLs Intravenous Not Given 03/26/19 0630)    ED Course  I have reviewed the triage vital signs and the nursing notes.  Pertinent labs & imaging results that were available during my care of the patient were reviewed by me and considered in my medical decision making (see chart for details).    MDM Rules/Calculators/A&P                      Patient most likely be discharged home she is not showing any signs of acute respiratory distress at this time.  The patient will be treated symptomatically.  Patient is advised to increase her fluid intake and rest as much as possible.  Told to return here for any worsening in her condition. Final Clinical Impression(s) / ED Diagnoses Final diagnoses:  None    Rx / DC Orders ED Discharge Orders    None       Dalia Heading, PA-C 03/26/19 1509    Charlesetta Shanks, MD 03/27/19 1048

## 2019-03-26 NOTE — Telephone Encounter (Signed)
TOC CM received call from pt stating she only had one Rx waiting at pharmacy. Contacted [facility] and Guaifenesin is over the counter. Attempted call to pt and left message. Sent pt a goodrx coupon for acetaminophen and codeine cough syrup for $10.88. 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 Informed pt that with her Medicaid she can receive a physical once per year. She will follow up with [facility] to schedule yearly physical. Jonnie Finner RN CCM, [place] ED TOC CM [phone]

## 2019-03-26 NOTE — ED Triage Notes (Addendum)
-  Patient here with reports of fever that started on Friday           ( temp upto 102) -PT reports taking 2 tabs of Ibuprofen (unsure per mg) -Reports SHOB that started on Saturday -Chest pain started on Saturday -Body aches since Friday  -Last Ibuprofen at 2:50am  --PT adds that she has dry cough - cramping pain in the chest with deep breathing

## 2019-03-26 NOTE — Discharge Instructions (Addendum)
Return here as needed.  Follow-up primary doctor.  Slowly increase your fluid intake.  Tylenol as needed for fever.  Your influenza test was negative.  You will need to quarantine until your Covid results have returned.

## 2019-03-27 ENCOUNTER — Telehealth: Payer: Self-pay | Admitting: Critical Care Medicine

## 2019-03-27 NOTE — Telephone Encounter (Signed)
  I connected by phone with Stacey Cobb on 03/27/2019 at 12:29 PM to discuss the potential use of an new treatment for mild to moderate COVID-19 viral infection in non-hospitalized patients.  This patient is a 23 y.o. female that meets the FDA criteria for Emergency Use Authorization of bamlanivimab or casirivimab\imdevimab.  Has a (+) direct SARS-CoV-2 viral test result  Has mild or moderate COVID-19   Is ? 25 years of age and weighs ? 40 kg  Is NOT hospitalized due to COVID-19  Is NOT requiring oxygen therapy or requiring an increase in baseline oxygen flow rate due to COVID-19  Is within 10 days of symptom onset  Has at least one of the high risk factor(s) for progression to severe COVID-19 and/or hospitalization as defined in EUA.  Specific high risk criteria : BMI >/= 35   I have spoken and communicated the following to the patient or parent/caregiver:  1. FDA has authorized the emergency use of bamlanivimab and casirivimab\imdevimab for the treatment of mild to moderate COVID-19 in adults and pediatric patients with positive results of direct SARS-CoV-2 viral testing who are 63 years of age and older weighing at least 40 kg, and who are at high risk for progressing to severe COVID-19 and/or hospitalization.  2. The significant known and potential risks and benefits of bamlanivimab and casirivimab\imdevimab, and the extent to which such potential risks and benefits are unknown.  3. Information on available alternative treatments and the risks and benefits of those alternatives, including clinical trials.  4. Patients treated with bamlanivimab and casirivimab\imdevimab should continue to self-isolate and use infection control measures (e.g., wear mask, isolate, social distance, avoid sharing personal items, clean and disinfect "high touch" surfaces, and frequent handwashing) according to CDC guidelines.   5. The patient or parent/caregiver has the option to accept or refuse  bamlanivimab or casirivimab\imdevimab .  After reviewing this information with the patient, The patient has DECLINED offer to receive the infusion.[name] 03/27/2019 12:29 PM

## 2019-06-30 ENCOUNTER — Emergency Department (HOSPITAL_COMMUNITY)
Admission: EM | Admit: 2019-06-30 | Discharge: 2019-07-01 | Disposition: A | Discharge status: Home / Self Care | Payer: Medicaid Other | Attending: Emergency Medicine | Admitting: Emergency Medicine

## 2019-06-30 DIAGNOSIS — Z5321 Procedure and treatment not carried out due to patient leaving prior to being seen by health care provider: Secondary | ICD-10-CM | POA: Insufficient documentation

## 2019-06-30 DIAGNOSIS — R2232 Localized swelling, mass and lump, left upper limb: Secondary | ICD-10-CM | POA: Insufficient documentation

## 2019-07-01 ENCOUNTER — Other Ambulatory Visit: Payer: Self-pay

## 2019-07-01 ENCOUNTER — Encounter (HOSPITAL_COMMUNITY): Payer: Self-pay | Admitting: Emergency Medicine

## 2019-07-01 ENCOUNTER — Emergency Department (HOSPITAL_COMMUNITY): Payer: Medicaid Other

## 2019-07-01 NOTE — ED Triage Notes (Signed)
Patient presents with left hand swelling/dried blood injured from a broken glass window this evening .

## 2020-05-18 IMAGING — CR DG HAND COMPLETE 3+V*L*
4 series · 4 of 4 positions shown · non-contrast
Comparison: None.

CLINICAL DATA: Hit hand on glass window. Fourth and 5th metacarpal
pain, swelling

EXAM:
LEFT HAND - COMPLETE 3+ VIEW

[hand pa (1 of 2)]
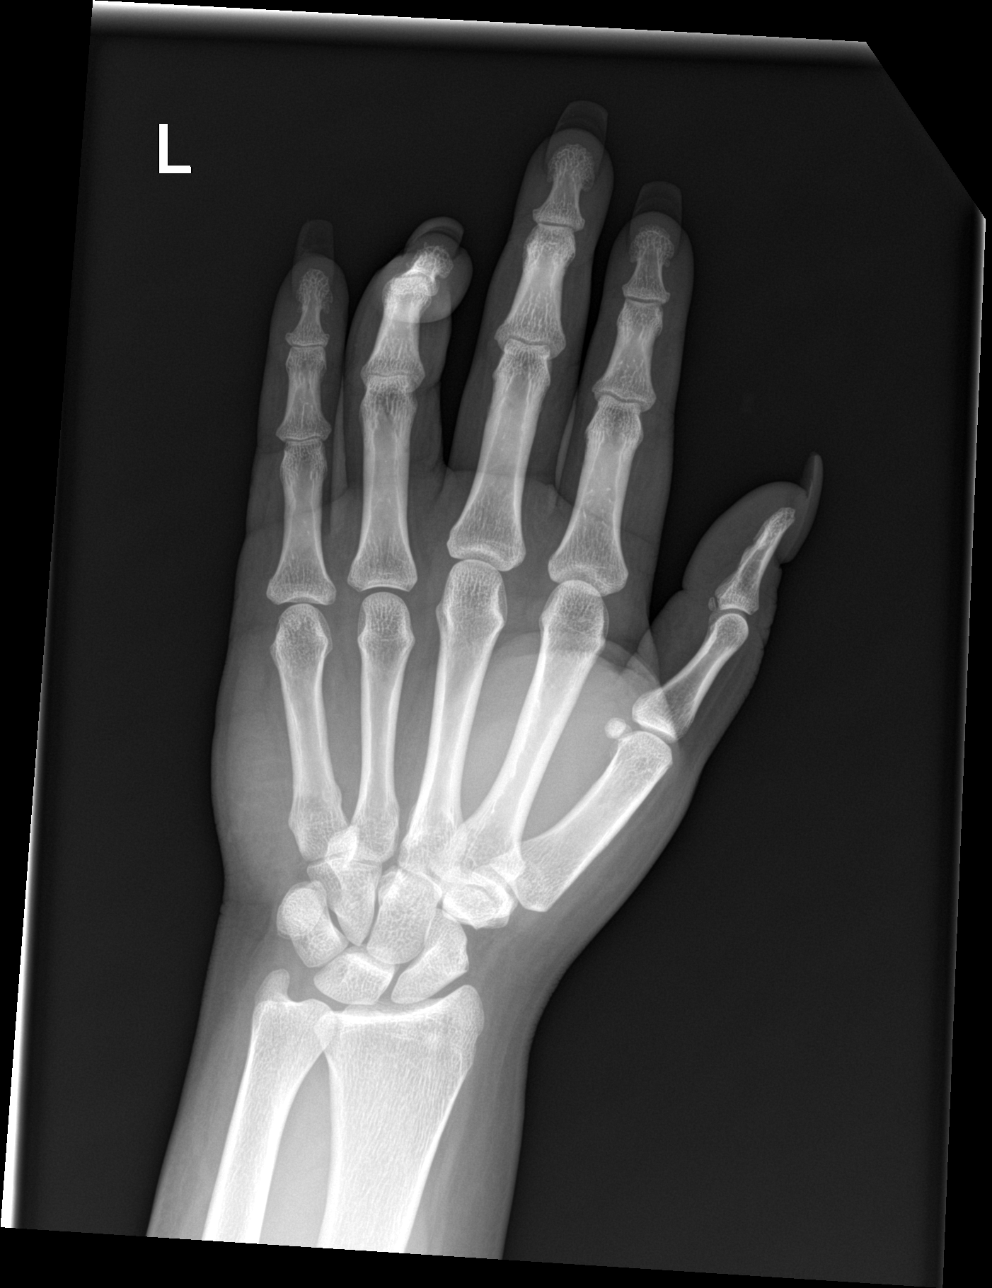

[hand obl]
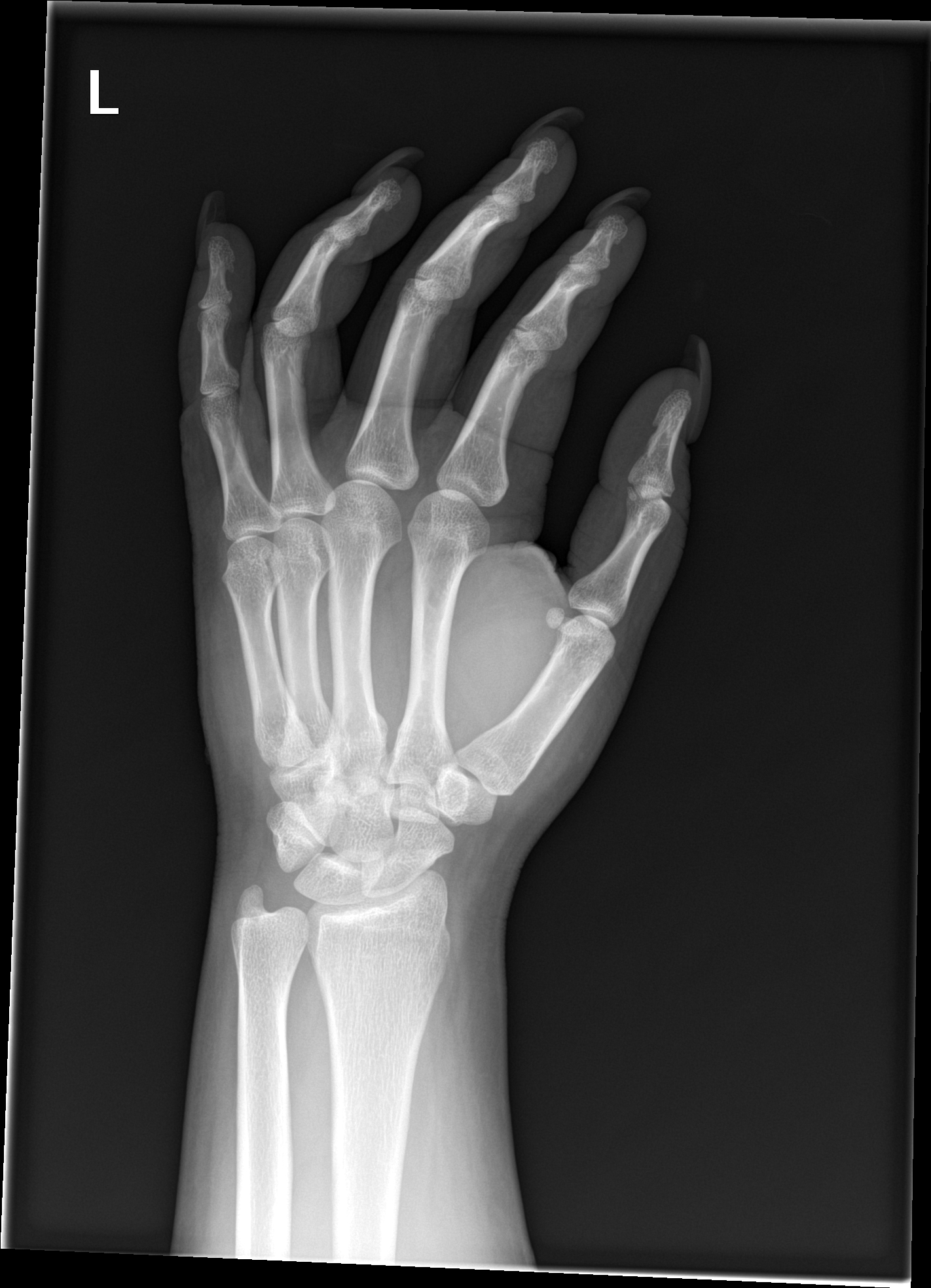

[hand lat]
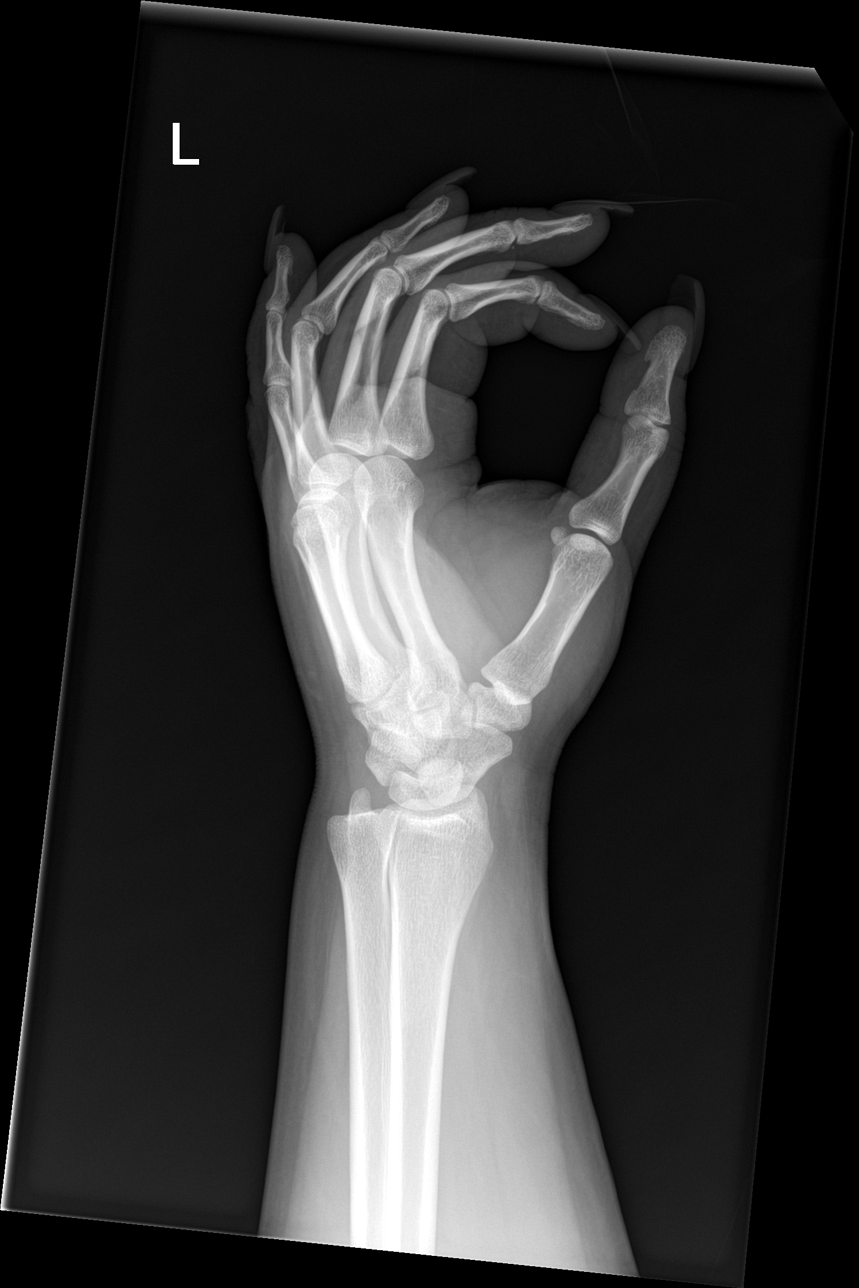

[hand pa (2 of 2)]
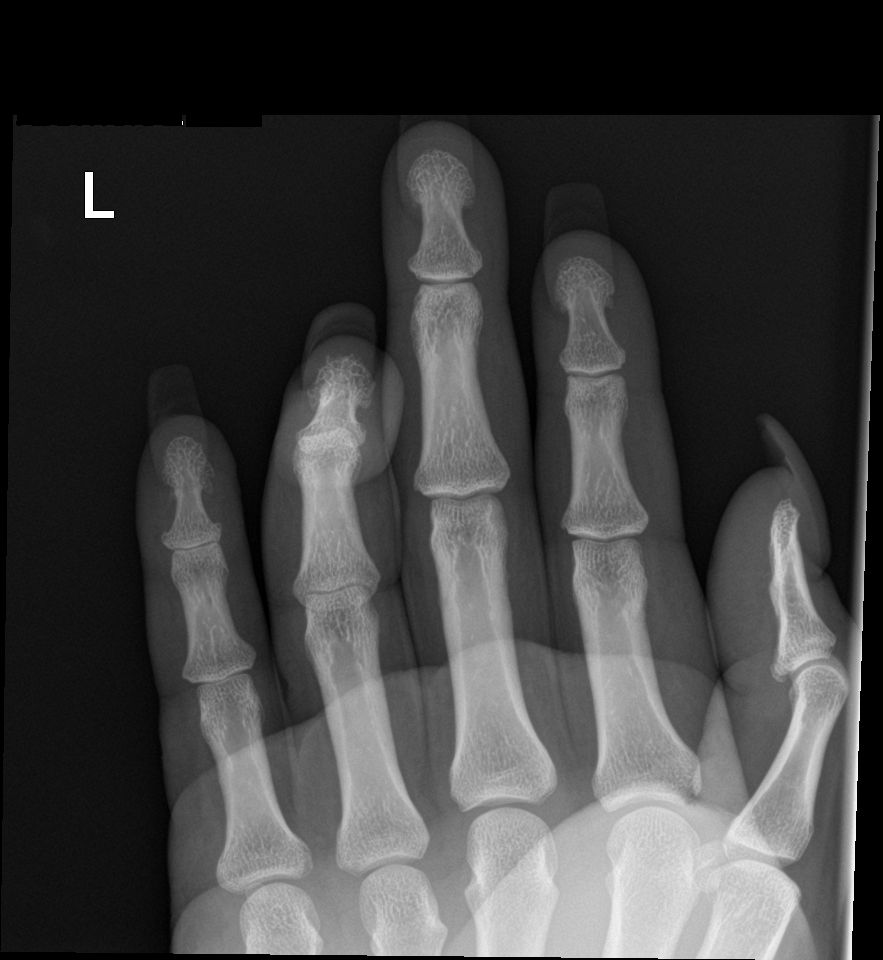

[4 of 4 positions shown; findings below may reference images not displayed]

FINDINGS: There is no evidence of fracture or dislocation. There is no
evidence of arthropathy or other focal bone abnormality. Soft
tissues are unremarkable.
IMPRESSION: Negative.

## 2022-05-23 DIAGNOSIS — J36 Peritonsillar abscess: Secondary | ICD-10-CM | POA: Diagnosis not present

## 2022-05-23 DIAGNOSIS — Z20822 Contact with and (suspected) exposure to covid-19: Secondary | ICD-10-CM | POA: Diagnosis not present

## 2022-05-26 DIAGNOSIS — J039 Acute tonsillitis, unspecified: Secondary | ICD-10-CM | POA: Diagnosis not present

## 2022-08-09 DIAGNOSIS — N925 Other specified irregular menstruation: Secondary | ICD-10-CM | POA: Diagnosis not present

## 2022-08-09 DIAGNOSIS — Z3009 Encounter for other general counseling and advice on contraception: Secondary | ICD-10-CM | POA: Diagnosis not present

## 2022-08-25 DIAGNOSIS — N925 Other specified irregular menstruation: Secondary | ICD-10-CM | POA: Diagnosis not present

## 2022-08-25 DIAGNOSIS — N938 Other specified abnormal uterine and vaginal bleeding: Secondary | ICD-10-CM | POA: Diagnosis not present

## 2023-05-04 DIAGNOSIS — Z0000 Encounter for general adult medical examination without abnormal findings: Secondary | ICD-10-CM | POA: Diagnosis not present

## 2023-05-04 DIAGNOSIS — Z833 Family history of diabetes mellitus: Secondary | ICD-10-CM | POA: Diagnosis not present

## 2023-05-04 DIAGNOSIS — D539 Nutritional anemia, unspecified: Secondary | ICD-10-CM | POA: Diagnosis not present

## 2023-05-04 DIAGNOSIS — Z1331 Encounter for screening for depression: Secondary | ICD-10-CM | POA: Diagnosis not present

## 2023-05-04 DIAGNOSIS — Z01419 Encounter for gynecological examination (general) (routine) without abnormal findings: Secondary | ICD-10-CM | POA: Diagnosis not present
